# Patient Record
Sex: Female | Born: 1965 | Race: White | Hispanic: No | Marital: Married | Smoking: Never smoker
Health system: Southern US, Community
[De-identification: ages and names within clinical notes are randomized; demographics above are authoritative.]

## PROBLEM LIST (undated history)

## (undated) DIAGNOSIS — J45909 Unspecified asthma, uncomplicated: Secondary | ICD-10-CM

## (undated) DIAGNOSIS — G709 Myoneural disorder, unspecified: Secondary | ICD-10-CM

## (undated) DIAGNOSIS — Z9889 Other specified postprocedural states: Secondary | ICD-10-CM

## (undated) DIAGNOSIS — C439 Malignant melanoma of skin, unspecified: Secondary | ICD-10-CM

## (undated) DIAGNOSIS — Z8719 Personal history of other diseases of the digestive system: Secondary | ICD-10-CM

## (undated) DIAGNOSIS — G43909 Migraine, unspecified, not intractable, without status migrainosus: Secondary | ICD-10-CM

## (undated) DIAGNOSIS — F419 Anxiety disorder, unspecified: Secondary | ICD-10-CM

## (undated) DIAGNOSIS — R5383 Other fatigue: Secondary | ICD-10-CM

## (undated) DIAGNOSIS — H538 Other visual disturbances: Secondary | ICD-10-CM

## (undated) DIAGNOSIS — C4491 Basal cell carcinoma of skin, unspecified: Secondary | ICD-10-CM

## (undated) DIAGNOSIS — D649 Anemia, unspecified: Secondary | ICD-10-CM

## (undated) DIAGNOSIS — I10 Essential (primary) hypertension: Secondary | ICD-10-CM

## (undated) DIAGNOSIS — K469 Unspecified abdominal hernia without obstruction or gangrene: Secondary | ICD-10-CM

## (undated) DIAGNOSIS — M1990 Unspecified osteoarthritis, unspecified site: Secondary | ICD-10-CM

## (undated) DIAGNOSIS — R202 Paresthesia of skin: Secondary | ICD-10-CM

## (undated) DIAGNOSIS — M2550 Pain in unspecified joint: Secondary | ICD-10-CM

## (undated) DIAGNOSIS — R112 Nausea with vomiting, unspecified: Secondary | ICD-10-CM

## (undated) DIAGNOSIS — M5030 Other cervical disc degeneration, unspecified cervical region: Secondary | ICD-10-CM

## (undated) HISTORY — PX: TUBAL LIGATION: SHX77

## (undated) HISTORY — DX: Unspecified osteoarthritis, unspecified site: M19.90

## (undated) HISTORY — DX: Personal history of other diseases of the digestive system: Z87.19

## (undated) HISTORY — PX: MELANOMA EXCISION: SHX5266

## (undated) HISTORY — DX: Migraine, unspecified, not intractable, without status migrainosus: G43.909

## (undated) HISTORY — PX: HERNIA REPAIR: SHX51

## (undated) HISTORY — DX: Myoneural disorder, unspecified: G70.9

## (undated) HISTORY — PX: ABDOMINAL HYSTERECTOMY: SHX81

## (undated) HISTORY — PX: BREAST EXCISIONAL BIOPSY: SUR124

## (undated) HISTORY — DX: Essential (primary) hypertension: I10

## (undated) HISTORY — PX: OTHER SURGICAL HISTORY: SHX169

## (undated) HISTORY — PX: BREAST LUMPECTOMY: SHX2

## (undated) HISTORY — DX: Unspecified abdominal hernia without obstruction or gangrene: K46.9

## (undated) HISTORY — DX: Malignant melanoma of skin, unspecified: C43.9

## (undated) HISTORY — DX: Anemia, unspecified: D64.9

## (undated) HISTORY — PX: BREAST SURGERY: SHX581

## (undated) HISTORY — DX: Basal cell carcinoma of skin, unspecified: C44.91

## (undated) HISTORY — DX: Unspecified asthma, uncomplicated: J45.909

## (undated) HISTORY — PX: AUGMENTATION MAMMAPLASTY: SUR837

---

## 2000-06-21 HISTORY — PX: BREAST SURGERY: SHX581

## 2001-02-17 ENCOUNTER — Ambulatory Visit (HOSPITAL_COMMUNITY): Admission: RE | Admit: 2001-02-17 | Discharge: 2001-02-17 | Payer: Self-pay | Admitting: General Surgery

## 2006-06-21 HISTORY — PX: AUGMENTATION MAMMAPLASTY: SUR837

## 2009-08-05 ENCOUNTER — Encounter: Payer: Self-pay | Admitting: Cardiovascular Disease

## 2010-03-31 ENCOUNTER — Encounter: Admission: RE | Admit: 2010-03-31 | Discharge: 2010-03-31 | Payer: Self-pay | Admitting: Obstetrics & Gynecology

## 2012-07-17 ENCOUNTER — Ambulatory Visit (HOSPITAL_COMMUNITY)
Admission: RE | Admit: 2012-07-17 | Discharge: 2012-07-17 | Disposition: A | Discharge status: Home / Self Care | Payer: BC Managed Care – PPO | Source: Ambulatory Visit | Attending: Surgery | Admitting: Surgery

## 2012-07-17 ENCOUNTER — Other Ambulatory Visit (HOSPITAL_COMMUNITY): Payer: Self-pay | Admitting: *Deleted

## 2012-07-17 DIAGNOSIS — R52 Pain, unspecified: Secondary | ICD-10-CM

## 2012-07-17 DIAGNOSIS — R079 Chest pain, unspecified: Secondary | ICD-10-CM | POA: Insufficient documentation

## 2012-08-01 ENCOUNTER — Ambulatory Visit (INDEPENDENT_AMBULATORY_CARE_PROVIDER_SITE_OTHER): Payer: BC Managed Care – PPO | Admitting: Surgery

## 2012-08-01 ENCOUNTER — Encounter (INDEPENDENT_AMBULATORY_CARE_PROVIDER_SITE_OTHER): Payer: Self-pay | Admitting: Surgery

## 2012-08-01 VITALS — BP 112/64 | HR 74 | Temp 97.5°F | Resp 18 | Ht 63.0 in | Wt 122.6 lb

## 2012-08-01 DIAGNOSIS — N63 Unspecified lump in unspecified breast: Secondary | ICD-10-CM

## 2012-08-01 DIAGNOSIS — N6320 Unspecified lump in the left breast, unspecified quadrant: Secondary | ICD-10-CM | POA: Insufficient documentation

## 2012-08-01 NOTE — Patient Instructions (Signed)
We will schedule outpatient surgery to remove the small mass from your left breast.

## 2012-08-01 NOTE — Progress Notes (Signed)
Patient ID: Kayla Harris, female   DOB: 03/03/1966, 46 y.o.   MRN: [id number]  Chief Complaint  Patient presents with  . Breast Mass    Left    HPI Kayla Harris is a 46 y.o. female.  She was to have a mammogram followed by ultrasound. Solid mass was found at the 5 cm above the left nipple 12:00 position. She also had some adjacent cysts. She has implants. Recommendations for her followup core biopsy or excisional biopsy were made by the radiologist. She initially planned and it core biopsy but has now decided to have an excisional biopsy. She has a paternal aunt who died of breast cancer died at age 46. No history of ovarian cancer. She is quite concerned about this area because both her parents have had one type of cancer or another. She does not have any other significant symptoms. She had implants and "uplifts "about 6 years ago done at [place].  HPI  Past Medical History  Diagnosis Date  . Migraines   . Hernia   . Anemia   . Arthritis   . Asthma   . Melanoma     X3  . Hypertension   . Neuromuscular disorder     Past Surgical History  Procedure Laterality Date  . Tubal ligation      And reverse tubal ligation  . Hernia repair    . Breast surgery      "uplift"  . Tubal reversal    . Melanoma excision      X3    Family History  Problem Relation Age of Onset  . Colon cancer Mother   . Cancer Mother     Colon  . Colon cancer Father   . Cancer Father     Colon  . Cancer Paternal Aunt     Breast    Social History History  Substance Use Topics  . Smoking status: Never Smoker   . Smokeless tobacco: Never Used  . Alcohol Use: No    Allergies  Allergen Reactions  . Codeine Itching  . Imitrex (Sumatriptan)     Developed chest pain   . Penicillins     No current outpatient prescriptions on file.   No current facility-administered medications for this visit.    Review of Systems Review of Systems  Constitutional: Negative for fever, chills and  unexpected weight change.  HENT: Negative for hearing loss, congestion, sore throat, trouble swallowing and voice change.   Eyes: Negative for visual disturbance.  Respiratory: Negative for cough and wheezing.   Cardiovascular: Negative for chest pain, palpitations and leg swelling.  Gastrointestinal: Negative for nausea, vomiting, abdominal pain, diarrhea, constipation, blood in stool, abdominal distention and anal bleeding.  Genitourinary: Negative for hematuria, vaginal bleeding and difficulty urinating.  Musculoskeletal: Positive for arthralgias.  Skin: Negative for rash and wound.  Neurological: Negative for seizures, syncope and headaches.  Hematological: Negative for adenopathy. Does not bruise/bleed easily.  Psychiatric/Behavioral: Negative for confusion.    Blood pressure 112/64, pulse 74, temperature 97.5 F (36.4 C), temperature source Temporal, resp. rate 18, height 5' 3" (1.6 m), weight 122 lb 9.6 oz (55.611 kg), last menstrual period 07/05/2012.  Physical Exam Physical Exam  Vitals reviewed. Constitutional: She is oriented to person, place, and time. She appears well-developed and well-nourished. No distress.  HENT:  Head: Normocephalic and atraumatic.  Mouth/Throat: Oropharynx is clear and moist.  Eyes: Conjunctivae and EOM are normal. Pupils are equal, round, and reactive to light. No   scleral icterus.  Neck: Normal range of motion. Neck supple. No tracheal deviation present. No thyromegaly present.  Cardiovascular: Normal rate, regular rhythm, normal heart sounds and intact distal pulses.  Exam reveals no gallop and no friction rub.   No murmur heard. Pulmonary/Chest: Effort normal and breath sounds normal. No respiratory distress. She has no wheezes. She has no rales. Right breast exhibits no inverted nipple, no mass, no nipple discharge, no skin change and no tenderness. Left breast exhibits no inverted nipple, no mass, no nipple discharge, no skin change and no  tenderness. Breasts are symmetrical.  She is status post bilateral implant surgery.  Abdominal: Soft. Bowel sounds are normal. She exhibits no distension and no mass. There is no tenderness. There is no rebound and no guarding.  Musculoskeletal: Normal range of motion. She exhibits no edema and no tenderness.  Neurological: She is alert and oriented to person, place, and time.  Skin: Skin is warm and dry. No rash noted. She is not diaphoretic. No erythema.  Psychiatric: She has a normal mood and affect. Her behavior is normal. Judgment and thought content normal.    Data Reviewed I reviewed the mammogram and ultrasound reports done at the hospital in [place]. I used our office ultrasound but could not definitely identify the lesion in question  Assessment    Solid left breast mass     Plan  We have discussed again the alternatives and she wishes to do an excisional biopsy with wire loc.  I have discussed the indications for the lumpectomy and described the procedure. She understand that the chance of removal of the abnormal area is very good, but that occasionally we are unable to locate it and may have to do a second procedure. We also discussed the possibility of a second procedure to get additional tissue. Risks of surgery such as bleeding and infection have also been explained, as well as the implications of not doing the surgery. She understands and wishes to proceed.         Krisha Beegle J 08/01/2012, 3:38 PM    

## 2012-08-02 ENCOUNTER — Telehealth (INDEPENDENT_AMBULATORY_CARE_PROVIDER_SITE_OTHER): Payer: Self-pay | Admitting: General Surgery

## 2012-08-02 NOTE — Telephone Encounter (Signed)
Request for CD of films faxed to [facility].

## 2012-08-15 ENCOUNTER — Encounter (INDEPENDENT_AMBULATORY_CARE_PROVIDER_SITE_OTHER): Payer: Self-pay

## 2012-08-16 ENCOUNTER — Encounter (HOSPITAL_BASED_OUTPATIENT_CLINIC_OR_DEPARTMENT_OTHER): Payer: Self-pay | Admitting: *Deleted

## 2012-08-16 NOTE — Progress Notes (Signed)
No labs needed

## 2012-08-22 ENCOUNTER — Ambulatory Visit
Admission: RE | Admit: 2012-08-22 | Discharge: 2012-08-22 | Disposition: A | Discharge status: Home / Self Care | Payer: BC Managed Care – PPO | Source: Ambulatory Visit | Attending: Surgery | Admitting: Surgery

## 2012-08-22 ENCOUNTER — Encounter (HOSPITAL_BASED_OUTPATIENT_CLINIC_OR_DEPARTMENT_OTHER): Payer: Self-pay | Admitting: Certified Registered Nurse Anesthetist

## 2012-08-22 ENCOUNTER — Ambulatory Visit (HOSPITAL_BASED_OUTPATIENT_CLINIC_OR_DEPARTMENT_OTHER)
Admission: RE | Admit: 2012-08-22 | Discharge: 2012-08-22 | Disposition: A | Discharge status: Home / Self Care | Payer: BC Managed Care – PPO | Source: Ambulatory Visit | Attending: Surgery | Admitting: Surgery

## 2012-08-22 ENCOUNTER — Encounter (HOSPITAL_BASED_OUTPATIENT_CLINIC_OR_DEPARTMENT_OTHER)
Admission: RE | Disposition: A | Discharge status: Home / Self Care | Payer: Self-pay | Source: Ambulatory Visit | Attending: Surgery

## 2012-08-22 ENCOUNTER — Other Ambulatory Visit (INDEPENDENT_AMBULATORY_CARE_PROVIDER_SITE_OTHER): Payer: Self-pay | Admitting: Surgery

## 2012-08-22 ENCOUNTER — Ambulatory Visit (HOSPITAL_BASED_OUTPATIENT_CLINIC_OR_DEPARTMENT_OTHER): Payer: BC Managed Care – PPO | Admitting: Certified Registered Nurse Anesthetist

## 2012-08-22 DIAGNOSIS — N6320 Unspecified lump in the left breast, unspecified quadrant: Secondary | ICD-10-CM

## 2012-08-22 DIAGNOSIS — M129 Arthropathy, unspecified: Secondary | ICD-10-CM | POA: Insufficient documentation

## 2012-08-22 DIAGNOSIS — Z85820 Personal history of malignant melanoma of skin: Secondary | ICD-10-CM | POA: Insufficient documentation

## 2012-08-22 DIAGNOSIS — D249 Benign neoplasm of unspecified breast: Secondary | ICD-10-CM | POA: Insufficient documentation

## 2012-08-22 DIAGNOSIS — J45909 Unspecified asthma, uncomplicated: Secondary | ICD-10-CM | POA: Insufficient documentation

## 2012-08-22 DIAGNOSIS — I10 Essential (primary) hypertension: Secondary | ICD-10-CM | POA: Insufficient documentation

## 2012-08-22 HISTORY — PX: BREAST BIOPSY: SHX20

## 2012-08-22 HISTORY — DX: Other specified postprocedural states: Z98.890

## 2012-08-22 HISTORY — DX: Nausea with vomiting, unspecified: R11.2

## 2012-08-22 SURGERY — BREAST BIOPSY WITH NEEDLE LOCALIZATION
Anesthesia: General | Site: Breast | Laterality: Left | Wound class: Clean

## 2012-08-22 MED ORDER — OXYCODONE HCL 5 MG/5ML PO SOLN: 5.0000 mg | Freq: Once | ORAL | Status: AC | PRN

## 2012-08-22 MED ORDER — ONDANSETRON HCL 4 MG/2ML IJ SOLN
INTRAMUSCULAR | Status: DC | PRN
  Administered 2012-08-22: 4 mg via INTRAVENOUS

## 2012-08-22 MED ORDER — MIDAZOLAM HCL 2 MG/2ML IJ SOLN: 1.0000 mg | INTRAMUSCULAR | Status: DC | PRN

## 2012-08-22 MED ORDER — FENTANYL CITRATE 0.05 MG/ML IJ SOLN
INTRAMUSCULAR | Status: DC | PRN
  Administered 2012-08-22: 50 ug via INTRAVENOUS

## 2012-08-22 MED ORDER — CHLORHEXIDINE GLUCONATE 4 % EX LIQD: 1.0000 "application " | Freq: Once | CUTANEOUS | Status: DC

## 2012-08-22 MED ORDER — LIDOCAINE HCL (CARDIAC) 20 MG/ML IV SOLN
INTRAVENOUS | Status: DC | PRN
  Administered 2012-08-22: 60 mg via INTRAVENOUS

## 2012-08-22 MED ORDER — BUPIVACAINE HCL (PF) 0.25 % IJ SOLN
INTRAMUSCULAR | Status: DC | PRN
  Administered 2012-08-22: 20 mL

## 2012-08-22 MED ORDER — EPHEDRINE SULFATE 50 MG/ML IJ SOLN
INTRAMUSCULAR | Status: DC | PRN
  Administered 2012-08-22: 10 mg via INTRAVENOUS

## 2012-08-22 MED ORDER — FENTANYL CITRATE 0.05 MG/ML IJ SOLN: 50.0000 ug | INTRAMUSCULAR | Status: DC | PRN

## 2012-08-22 MED ORDER — PROPOFOL 10 MG/ML IV BOLUS
INTRAVENOUS | Status: DC | PRN
  Administered 2012-08-22: 200 mg via INTRAVENOUS

## 2012-08-22 MED ORDER — SCOPOLAMINE 1 MG/3DAYS TD PT72
1.0000 | MEDICATED_PATCH | TRANSDERMAL | Status: DC
  Administered 2012-08-22: 1.5 mg via TRANSDERMAL

## 2012-08-22 MED ORDER — HYDROMORPHONE HCL PF 1 MG/ML IJ SOLN
0.2500 mg | INTRAMUSCULAR | Status: DC | PRN
  Administered 2012-08-22: 0.5 mg via INTRAVENOUS
  Administered 2012-08-22: 0.25 mg via INTRAVENOUS

## 2012-08-22 MED ORDER — MIDAZOLAM HCL 5 MG/5ML IJ SOLN
INTRAMUSCULAR | Status: DC | PRN
  Administered 2012-08-22: 2 mg via INTRAVENOUS

## 2012-08-22 MED ORDER — ACETAMINOPHEN 10 MG/ML IV SOLN
1000.0000 mg | Freq: Once | INTRAVENOUS | Status: AC
  Administered 2012-08-22: 1000 mg via INTRAVENOUS

## 2012-08-22 MED ORDER — OXYCODONE HCL 5 MG PO TABS
5.0000 mg | ORAL_TABLET | Freq: Once | ORAL | Status: AC | PRN
  Administered 2012-08-22: 5 mg via ORAL

## 2012-08-22 MED ORDER — LACTATED RINGERS IV SOLN
INTRAVENOUS | Status: DC
  Administered 2012-08-22 (×2): via INTRAVENOUS

## 2012-08-22 MED ORDER — CIPROFLOXACIN IN D5W 400 MG/200ML IV SOLN
400.0000 mg | INTRAVENOUS | Status: AC
  Administered 2012-08-22: 400 mg via INTRAVENOUS

## 2012-08-22 MED ORDER — HYDROMORPHONE HCL 2 MG PO TABS: 2.0000 mg | ORAL_TABLET | ORAL | Status: DC | PRN

## 2012-08-22 MED ORDER — DEXAMETHASONE SODIUM PHOSPHATE 4 MG/ML IJ SOLN
INTRAMUSCULAR | Status: DC | PRN
  Administered 2012-08-22: 10 mg via INTRAVENOUS

## 2012-08-22 SURGICAL SUPPLY — 51 items
ADH SKN CLS APL DERMABOND .7 (GAUZE/BANDAGES/DRESSINGS) ×1
APPLICATOR COTTON TIP 6IN STRL (MISCELLANEOUS) IMPLANT
BINDER BREAST LRG (GAUZE/BANDAGES/DRESSINGS) IMPLANT
BINDER BREAST MEDIUM (GAUZE/BANDAGES/DRESSINGS) IMPLANT
BINDER BREAST XLRG (GAUZE/BANDAGES/DRESSINGS) IMPLANT
BINDER BREAST XXLRG (GAUZE/BANDAGES/DRESSINGS) IMPLANT
BLADE HEX COATED 2.75 (ELECTRODE) ×2 IMPLANT
BLADE SURG 15 STRL LF DISP TIS (BLADE) ×1 IMPLANT
BLADE SURG 15 STRL SS (BLADE) ×2
CANISTER SUCTION 1200CC (MISCELLANEOUS) ×1 IMPLANT
CHLORAPREP W/TINT 26ML (MISCELLANEOUS) ×2 IMPLANT
CLIP TI MEDIUM 6 (CLIP) IMPLANT
CLIP TI WIDE RED SMALL 6 (CLIP) IMPLANT
CLOTH BEACON ORANGE TIMEOUT ST (SAFETY) ×2 IMPLANT
COVER MAYO STAND STRL (DRAPES) ×2 IMPLANT
COVER TABLE BACK 60X90 (DRAPES) ×2 IMPLANT
DECANTER SPIKE VIAL GLASS SM (MISCELLANEOUS) IMPLANT
DERMABOND ADVANCED (GAUZE/BANDAGES/DRESSINGS) ×1
DERMABOND ADVANCED .7 DNX12 (GAUZE/BANDAGES/DRESSINGS) ×1 IMPLANT
DEVICE DUBIN W/COMP PLATE 8390 (MISCELLANEOUS) IMPLANT
DRAPE LAPAROTOMY TRNSV 102X78 (DRAPE) ×2 IMPLANT
DRAPE SURG 17X23 STRL (DRAPES) ×1 IMPLANT
DRAPE UTILITY XL STRL (DRAPES) ×2 IMPLANT
ELECT COATED BLADE 2.86 ST (ELECTRODE) ×1 IMPLANT
ELECT REM PT RETURN 9FT ADLT (ELECTROSURGICAL) ×2
ELECTRODE REM PT RTRN 9FT ADLT (ELECTROSURGICAL) ×1 IMPLANT
GLOVE BIO SURGEON STRL SZ 6.5 (GLOVE) ×2 IMPLANT
GLOVE EUDERMIC 7 POWDERFREE (GLOVE) ×2 IMPLANT
GOWN PREVENTION PLUS XLARGE (GOWN DISPOSABLE) ×5 IMPLANT
KIT MARKER MARGIN INK (KITS) IMPLANT
NDL HYPO 25X1 1.5 SAFETY (NEEDLE) ×1 IMPLANT
NEEDLE HYPO 22GX1.5 SAFETY (NEEDLE) ×1 IMPLANT
NEEDLE HYPO 25X1 1.5 SAFETY (NEEDLE) ×2 IMPLANT
NS IRRIG 1000ML POUR BTL (IV SOLUTION) IMPLANT
PACK BASIN DAY SURGERY FS (CUSTOM PROCEDURE TRAY) ×2 IMPLANT
PENCIL BUTTON HOLSTER BLD 10FT (ELECTRODE) ×2 IMPLANT
SHEET MEDIUM DRAPE 40X70 STRL (DRAPES) ×1 IMPLANT
SLEEVE SCD COMPRESS KNEE MED (MISCELLANEOUS) ×2 IMPLANT
SPONGE GAUZE 4X4 12PLY (GAUZE/BANDAGES/DRESSINGS) IMPLANT
SPONGE INTESTINAL PEANUT (DISPOSABLE) IMPLANT
SPONGE LAP 4X18 X RAY DECT (DISPOSABLE) ×2 IMPLANT
STAPLER VISISTAT 35W (STAPLE) IMPLANT
SUT MNCRL AB 4-0 PS2 18 (SUTURE) ×2 IMPLANT
SUT SILK 0 TIES 10X30 (SUTURE) IMPLANT
SUT SILK 2 0 FS (SUTURE) IMPLANT
SUT VICRYL 3-0 CR8 SH (SUTURE) ×2 IMPLANT
SYR CONTROL 10ML LL (SYRINGE) ×2 IMPLANT
TOWEL OR NON WOVEN STRL DISP B (DISPOSABLE) ×1 IMPLANT
TUBE CONNECTING 20X1/4 (TUBING) ×2 IMPLANT
WATER STERILE IRR 1000ML POUR (IV SOLUTION) ×1 IMPLANT
YANKAUER SUCT BULB TIP NO VENT (SUCTIONS) ×2 IMPLANT

## 2012-08-22 NOTE — Op Note (Signed)
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  Description of procedure: The patient was seen in the holding area and the plans for the procedure reviewed. The left breast was marked as the operative side. The wire localizing films were reviewed.  The patient was taken to the operating room and after satisfactory general anesthesia had been obtained the left breast was prepped and draped and the timeout was performed.  The incision was made over the presumed area of the mass. The guidewire entered laterally and tracked medially. The tract of the wire was grasped with an Allis clamp and the area excised trying to stay posteror to the wire as it appeared to be at the anterior edge of the lesion; As I got towards the end of the tract I saw a well circumcribed nodule consistent with a fibroadenoma and thought this was the abnormality to be removed. I didn't take more posterior tissue as I was concerned about getting too close to her implant.The specimen mammogram was reviewed with the radiologist  . Bleeders were controlled with either cautery or sutures as needed.  After achieving hemostasis, the incision was closed with 3-0 Vicryl, 4-0 Monocryl subcuticular, and Dermabond.  The patient tolerated the procedure well. There were no operative complications. All counts were correct.   EBL: Minimal  Currie Paris, MD, FACS 08/22/2012 11:05 AM

## 2012-08-22 NOTE — Anesthesia Preprocedure Evaluation (Signed)
Anesthesia Evaluation  Patient identified by MRN, date of birth, ID band Patient awake    Reviewed: Allergy & Precautions, H&P , NPO status , Patient's Chart, lab work & pertinent test results  History of Anesthesia Complications (+) PONV  Airway Mallampati: II TM Distance: >3 FB Neck ROM: Full    Dental no notable dental hx. (+) Teeth Intact and Dental Advisory Given   Pulmonary neg pulmonary ROS,  breath sounds clear to auscultation  Pulmonary exam normal       Cardiovascular negative cardio ROS  Rhythm:Regular Rate:Normal     Neuro/Psych negative neurological ROS  negative psych ROS   GI/Hepatic negative GI ROS, Neg liver ROS,   Endo/Other  negative endocrine ROS  Renal/GU negative Renal ROS  negative genitourinary   Musculoskeletal   Abdominal   Peds  Hematology negative hematology ROS (+)   Anesthesia Other Findings   Reproductive/Obstetrics negative OB ROS                           Anesthesia Physical Anesthesia Plan  ASA: I  Anesthesia Plan: General   Post-op Pain Management:    Induction: Intravenous  Airway Management Planned: LMA  Additional Equipment:   Intra-op Plan:   Post-operative Plan: Extubation in OR  Informed Consent: I have reviewed the patients History and Physical, chart, labs and discussed the procedure including the risks, benefits and alternatives for the proposed anesthesia with the patient or authorized representative who has indicated his/her understanding and acceptance.   Dental advisory given  Plan Discussed with: CRNA  Anesthesia Plan Comments:         Anesthesia Quick Evaluation

## 2012-08-22 NOTE — Anesthesia Procedure Notes (Signed)
Procedure Name: LMA Insertion Date/Time: 08/22/2012 10:19 AM Performed by: BLOCKER, TIMOTHY D Pre-anesthesia Checklist: Patient identified, Emergency Drugs available, Suction available and Patient being monitored Patient Re-evaluated:Patient Re-evaluated prior to inductionOxygen Delivery Method: Circle System Utilized Preoxygenation: Pre-oxygenation with 100% oxygen Intubation Type: IV induction Ventilation: Mask ventilation without difficulty LMA: LMA with gastric port inserted LMA Size: 4.0 Number of attempts: 1 Placement Confirmation: positive ETCO2 Tube secured with: Tape Dental Injury: Teeth and Oropharynx as per pre-operative assessment

## 2012-08-22 NOTE — Interval H&P Note (Signed)
History and Physical Interval Note:  08/22/2012 10:02 AM  Kayla Harris  has presented today for surgery, with the diagnosis of left breast mass  The various methods of treatment have been discussed with the patient and family. After consideration of risks, benefits and other options for treatment, the patient has consented to  Procedure(s) with comments: Needle localization removal left breast mass (Left) - Needle localization removal left breast mass as a surgical intervention .  The patient's history has been reviewed, patient examined, no change in status, stable for surgery.  I have reviewed the patient's chart and labs.  Questions were answered to the patient's satisfaction.     STRECK,CHRISTIAN J

## 2012-08-22 NOTE — Anesthesia Postprocedure Evaluation (Signed)
  Anesthesia Post-op Note  Patient: Kayla Harris  Procedure(s) Performed: Procedure(s) with comments: Needle localization removal left breast mass (Left) - Needle localization removal left breast mass  Patient Location: PACU  Anesthesia Type:General  Level of Consciousness: awake and alert   Airway and Oxygen Therapy: Patient Spontanous Breathing  Post-op Pain: mild  Post-op Assessment: Post-op Vital signs reviewed, Patient's Cardiovascular Status Stable, Respiratory Function Stable, Patent Airway and No signs of Nausea or vomiting  Post-op Vital Signs: Reviewed and stable  Complications: No apparent anesthesia complications

## 2012-08-22 NOTE — Transfer of Care (Signed)
Immediate Anesthesia Transfer of Care Note  Patient: Kayla Harris  Procedure(s) Performed: Procedure(s) with comments: Needle localization removal left breast mass (Left) - Needle localization removal left breast mass  Patient Location: PACU  Anesthesia Type:General  Level of Consciousness: awake, alert , oriented and patient cooperative  Airway & Oxygen Therapy: Patient Spontanous Breathing and Patient connected to face mask oxygen  Post-op Assessment: Report given to PACU RN and Post -op Vital signs reviewed and stable  Post vital signs: Reviewed and stable  Complications: No apparent anesthesia complications

## 2012-08-22 NOTE — H&P (View-Only) (Signed)
Patient ID: Kayla Harris, female   DOB: December 06, 1965, 47 y.o.   MRN: [id number]  Chief Complaint  Patient presents with  . Breast Mass    Left    HPI Kayla Harris is a 47 y.o. female.  She was to have a mammogram followed by ultrasound. Solid mass was found at the 5 cm above the left nipple 12:00 position. She also had some adjacent cysts. She has implants. Recommendations for her followup core biopsy or excisional biopsy were made by the radiologist. She initially planned and it core biopsy but has now decided to have an excisional biopsy. She has a paternal aunt who died of breast cancer died at age 32. No history of ovarian cancer. She is quite concerned about this area because both her parents have had one type of cancer or another. She does not have any other significant symptoms. She had implants and "uplifts "about 6 years ago done at [facility]  HPI  Past Medical History  Diagnosis Date  . Migraines   . Hernia   . Anemia   . Arthritis   . Asthma   . Melanoma     X3  . Hypertension   . Neuromuscular disorder     Past Surgical History  Procedure Laterality Date  . Tubal ligation      And reverse tubal ligation  . Hernia repair    . Breast surgery      "uplift"  . Tubal reversal    . Melanoma excision      X3    Family History  Problem Relation Age of Onset  . Colon cancer Mother   . Cancer Mother     Colon  . Colon cancer Father   . Cancer Father     Colon  . Cancer Paternal Aunt     Breast    Social History History  Substance Use Topics  . Smoking status: Never Smoker   . Smokeless tobacco: Never Used  . Alcohol Use: No    Allergies  Allergen Reactions  . Codeine Itching  . Imitrex (Sumatriptan)     Developed chest pain   . Penicillins     No current outpatient prescriptions on file.   No current facility-administered medications for this visit.    Review of Systems Review of Systems  Constitutional: Negative for fever, chills and  unexpected weight change.  HENT: Negative for hearing loss, congestion, sore throat, trouble swallowing and voice change.   Eyes: Negative for visual disturbance.  Respiratory: Negative for cough and wheezing.   Cardiovascular: Negative for chest pain, palpitations and leg swelling.  Gastrointestinal: Negative for nausea, vomiting, abdominal pain, diarrhea, constipation, blood in stool, abdominal distention and anal bleeding.  Genitourinary: Negative for hematuria, vaginal bleeding and difficulty urinating.  Musculoskeletal: Positive for arthralgias.  Skin: Negative for rash and wound.  Neurological: Negative for seizures, syncope and headaches.  Hematological: Negative for adenopathy. Does not bruise/bleed easily.  Psychiatric/Behavioral: Negative for confusion.    Blood pressure 112/64, pulse 74, temperature 97.5 F (36.4 C), temperature source Temporal, resp. rate 18, height 5\' 3"  (1.6 m), weight 122 lb 9.6 oz (55.611 kg), last menstrual period 07/05/2012.  Physical Exam Physical Exam  Vitals reviewed. Constitutional: She is oriented to person, place, and time. She appears well-developed and well-nourished. No distress.  HENT:  Head: Normocephalic and atraumatic.  Mouth/Throat: Oropharynx is clear and moist.  Eyes: Conjunctivae and EOM are normal. Pupils are equal, round, and reactive to light. No  scleral icterus.  Neck: Normal range of motion. Neck supple. No tracheal deviation present. No thyromegaly present.  Cardiovascular: Normal rate, regular rhythm, normal heart sounds and intact distal pulses.  Exam reveals no gallop and no friction rub.   No murmur heard. Pulmonary/Chest: Effort normal and breath sounds normal. No respiratory distress. She has no wheezes. She has no rales. Right breast exhibits no inverted nipple, no mass, no nipple discharge, no skin change and no tenderness. Left breast exhibits no inverted nipple, no mass, no nipple discharge, no skin change and no  tenderness. Breasts are symmetrical.  She is status post bilateral implant surgery.  Abdominal: Soft. Bowel sounds are normal. She exhibits no distension and no mass. There is no tenderness. There is no rebound and no guarding.  Musculoskeletal: Normal range of motion. She exhibits no edema and no tenderness.  Neurological: She is alert and oriented to person, place, and time.  Skin: Skin is warm and dry. No rash noted. She is not diaphoretic. No erythema.  Psychiatric: She has a normal mood and affect. Her behavior is normal. Judgment and thought content normal.    Data Reviewed I reviewed the mammogram and ultrasound reports done at the hospital in [place]. I used our office ultrasound but could not definitely identify the lesion in question  Assessment    Solid left breast mass     Plan  We have discussed again the alternatives and she wishes to do an excisional biopsy with wire loc.  I have discussed the indications for the lumpectomy and described the procedure. She understand that the chance of removal of the abnormal area is very good, but that occasionally we are unable to locate it and may have to do a second procedure. We also discussed the possibility of a second procedure to get additional tissue. Risks of surgery such as bleeding and infection have also been explained, as well as the implications of not doing the surgery. She understands and wishes to proceed.         Kayla Harris J 08/01/2012, 3:38 PM

## 2012-08-23 ENCOUNTER — Encounter (HOSPITAL_BASED_OUTPATIENT_CLINIC_OR_DEPARTMENT_OTHER): Payer: Self-pay | Admitting: Surgery

## 2012-08-23 ENCOUNTER — Telehealth (INDEPENDENT_AMBULATORY_CARE_PROVIDER_SITE_OTHER): Payer: Self-pay | Admitting: General Surgery

## 2012-08-23 NOTE — Telephone Encounter (Signed)
Message copied by Liliana Cline on Wed Aug 23, 2012  1:46 PM ------      Message from: Currie Paris      Created: Wed Aug 23, 2012  1:26 PM       Tell her path is benign and as expected ------

## 2012-08-23 NOTE — Telephone Encounter (Signed)
Patient aware path results are good. She will follow up in the office at her scheduled appt and call with any questions prior. She does complain of itching with pain medication. She states benadryl not helping. She is going to stop pain medicine and switch to ibuprofen. I advised if this does not cover her pain to let us know. She will call back if needed.

## 2012-08-23 NOTE — Addendum Note (Signed)
Addendum created 08/23/12 1610 by Jewel Baize Lillyan Hitson, CRNA   Modules edited: Anesthesia Responsible Staff

## 2012-09-05 ENCOUNTER — Encounter (INDEPENDENT_AMBULATORY_CARE_PROVIDER_SITE_OTHER): Payer: Self-pay | Admitting: Surgery

## 2012-09-05 ENCOUNTER — Ambulatory Visit (INDEPENDENT_AMBULATORY_CARE_PROVIDER_SITE_OTHER): Payer: BC Managed Care – PPO | Admitting: Surgery

## 2012-09-05 VITALS — BP 100/62 | HR 60 | Temp 98.6°F | Resp 18 | Ht 63.0 in | Wt 124.0 lb

## 2012-09-05 DIAGNOSIS — N6320 Unspecified lump in the left breast, unspecified quadrant: Secondary | ICD-10-CM

## 2012-09-05 DIAGNOSIS — N63 Unspecified lump in unspecified breast: Secondary | ICD-10-CM

## 2012-09-05 NOTE — Progress Notes (Signed)
NAME: Kayla Harris                                            DOB: 1965-07-25 DATE: 09/05/2012                                                  MRN: [id number]  CC:  Chief Complaint  Patient presents with  . Routine Post Op    1st po be mass    HPI: This patient comes in for post op follow-up .Sheunderwent removal of a left breast fibroadenoma on 08/22/12. She feels that she is doing well.  PE:  VITAL SIGNS: BP 100/62  Pulse 60  Temp(Src) 98.6 F (37 C) (Temporal)  Resp 18  Ht 5\' 3"  (1.6 m)  Wt 124 lb (56.246 kg)  BMI 21.97 kg/m2  General: The patient appears to be healthy, NAD Incision:Healing nicely, no infection or problem  DATA REVIEWED: Path: Diagnosis Breast, lumpectomy, Left - FIBROADENOMA, 1.7 CM. - THERE IS NO EVIDENCE OF MALIGNANCY. - SEE COMMENT.  IMPRESSION: The patient is doing well S/P excision of left breast fibroadenoma.    PLAN: RTC PRN

## 2012-09-05 NOTE — Patient Instructions (Signed)
We will see you again on an as needed basis. Please call the office at [phone] if you have any questions or concerns. Thank you for allowing us to take care of you.  

## 2013-01-02 ENCOUNTER — Encounter (HOSPITAL_COMMUNITY): Payer: Self-pay

## 2013-01-02 ENCOUNTER — Emergency Department (HOSPITAL_COMMUNITY)
Admission: EM | Admit: 2013-01-02 | Discharge: 2013-01-02 | Disposition: A | Discharge status: Home / Self Care | Payer: BC Managed Care – PPO | Attending: Emergency Medicine | Admitting: Emergency Medicine

## 2013-01-02 ENCOUNTER — Emergency Department (HOSPITAL_COMMUNITY): Payer: BC Managed Care – PPO

## 2013-01-02 ENCOUNTER — Other Ambulatory Visit: Payer: Self-pay

## 2013-01-02 DIAGNOSIS — Z8719 Personal history of other diseases of the digestive system: Secondary | ICD-10-CM | POA: Insufficient documentation

## 2013-01-02 DIAGNOSIS — R05 Cough: Secondary | ICD-10-CM | POA: Insufficient documentation

## 2013-01-02 DIAGNOSIS — R109 Unspecified abdominal pain: Secondary | ICD-10-CM | POA: Insufficient documentation

## 2013-01-02 DIAGNOSIS — Z880 Allergy status to penicillin: Secondary | ICD-10-CM | POA: Insufficient documentation

## 2013-01-02 DIAGNOSIS — I10 Essential (primary) hypertension: Secondary | ICD-10-CM | POA: Insufficient documentation

## 2013-01-02 DIAGNOSIS — M549 Dorsalgia, unspecified: Secondary | ICD-10-CM | POA: Insufficient documentation

## 2013-01-02 DIAGNOSIS — R0789 Other chest pain: Secondary | ICD-10-CM | POA: Insufficient documentation

## 2013-01-02 DIAGNOSIS — Z85820 Personal history of malignant melanoma of skin: Secondary | ICD-10-CM | POA: Insufficient documentation

## 2013-01-02 DIAGNOSIS — M542 Cervicalgia: Secondary | ICD-10-CM | POA: Insufficient documentation

## 2013-01-02 DIAGNOSIS — Z8679 Personal history of other diseases of the circulatory system: Secondary | ICD-10-CM | POA: Insufficient documentation

## 2013-01-02 DIAGNOSIS — Z862 Personal history of diseases of the blood and blood-forming organs and certain disorders involving the immune mechanism: Secondary | ICD-10-CM | POA: Insufficient documentation

## 2013-01-02 DIAGNOSIS — Z8739 Personal history of other diseases of the musculoskeletal system and connective tissue: Secondary | ICD-10-CM | POA: Insufficient documentation

## 2013-01-02 DIAGNOSIS — R059 Cough, unspecified: Secondary | ICD-10-CM | POA: Insufficient documentation

## 2013-01-02 DIAGNOSIS — R0602 Shortness of breath: Secondary | ICD-10-CM | POA: Insufficient documentation

## 2013-01-02 DIAGNOSIS — J45909 Unspecified asthma, uncomplicated: Secondary | ICD-10-CM | POA: Insufficient documentation

## 2013-01-02 DIAGNOSIS — Z8669 Personal history of other diseases of the nervous system and sense organs: Secondary | ICD-10-CM | POA: Insufficient documentation

## 2013-01-02 DIAGNOSIS — R079 Chest pain, unspecified: Secondary | ICD-10-CM

## 2013-01-02 DIAGNOSIS — R5381 Other malaise: Secondary | ICD-10-CM | POA: Insufficient documentation

## 2013-01-02 LAB — TROPONIN I: Troponin I: <0.3 ng/mL (ref <0.30)

## 2013-01-02 LAB — HEPATIC FUNCTION PANEL
ALT: 41 U/L — ABNORMAL HIGH (ref 0–35)
AST: 33 U/L (ref 0–37)
Albumin: 4.2 g/dL (ref 3.5–5.2)
Alkaline Phosphatase: 89 U/L (ref 39–117)
Total Protein: 7.5 g/dL (ref 6.0–8.3)

## 2013-01-02 LAB — URINE MICROSCOPIC-ADD ON

## 2013-01-02 LAB — URINALYSIS, ROUTINE W REFLEX MICROSCOPIC
Bilirubin Urine: NEGATIVE
Glucose, UA: NEGATIVE mg/dL
Specific Gravity, Urine: <1.005 — ABNORMAL LOW (ref 1.005–1.030)
Urobilinogen, UA: 0.2 mg/dL (ref 0.0–1.0)
pH: 5.5 (ref 5.0–8.0)

## 2013-01-02 LAB — BASIC METABOLIC PANEL
CO2: 27 mEq/L (ref 19–32)
Calcium: 9.8 mg/dL (ref 8.4–10.5)
Chloride: 102 mEq/L (ref 96–112)
Glucose, Bld: 96 mg/dL (ref 70–99)
Sodium: 139 mEq/L (ref 135–145)

## 2013-01-02 LAB — CBC WITH DIFFERENTIAL/PLATELET
Basophils Absolute: 0 10*3/uL (ref 0.0–0.1)
Eosinophils Relative: 1 % (ref 0–5)
HCT: 34 % — ABNORMAL LOW (ref 36.0–46.0)
Lymphocytes Relative: 25 % (ref 12–46)
Lymphs Abs: 2 10*3/uL (ref 0.7–4.0)
MCV: 84.8 fL (ref 78.0–100.0)
Neutro Abs: 5.2 10*3/uL (ref 1.7–7.7)
Platelets: 254 10*3/uL (ref 150–400)
RBC: 4.01 MIL/uL (ref 3.87–5.11)
RDW: 12 % (ref 11.5–15.5)
WBC: 8 10*3/uL (ref 4.0–10.5)

## 2013-01-02 MED ORDER — IOHEXOL 350 MG/ML SOLN
100.0000 mL | Freq: Once | INTRAVENOUS | Status: AC | PRN
  Administered 2013-01-02: 100 mL via INTRAVENOUS

## 2013-01-02 MED ORDER — LORAZEPAM 2 MG/ML IJ SOLN
0.5000 mg | Freq: Once | INTRAMUSCULAR | Status: AC
  Administered 2013-01-02: 0.5 mg via INTRAVENOUS
  Filled 2013-01-02: qty 1

## 2013-01-02 MED ORDER — OXYCODONE-ACETAMINOPHEN 5-325 MG PO TABS
1.0000 | ORAL_TABLET | Freq: Once | ORAL | Status: AC
  Administered 2013-01-02: 1 via ORAL
  Filled 2013-01-02: qty 1

## 2013-01-02 MED ORDER — ALBUTEROL SULFATE (5 MG/ML) 0.5% IN NEBU
5.0000 mg | INHALATION_SOLUTION | Freq: Once | RESPIRATORY_TRACT | Status: AC
  Administered 2013-01-02: 5 mg via RESPIRATORY_TRACT
  Filled 2013-01-02: qty 1

## 2013-01-02 MED ORDER — HYDROCODONE-ACETAMINOPHEN 5-325 MG PO TABS: 1.0000 | ORAL_TABLET | Freq: Four times a day (QID) | ORAL | Status: DC | PRN

## 2013-01-02 MED ORDER — LORAZEPAM 1 MG PO TABS: ORAL_TABLET | ORAL | Status: DC

## 2013-01-02 NOTE — ED Provider Notes (Signed)
History  This chart was scribed for Kayla Lennert, MD, by Kayla Harris, ED Scribe. This patient was seen in room APA02/APA02 and the patient's care was started at 5:05 PM  CSN: [id number] Arrival date & time 01/02/13  1644  First MD Initiated Contact with Patient 01/02/13 1652     Chief Complaint  Patient presents with  . Chest Pain  . Shortness of Breath    Patient is a 47 y.o. female presenting with chest pain. The history is provided by the patient. No language interpreter was used.  Chest Pain Pain location:  L chest Pain quality: aching   Pain radiates to:  Does not radiate Pain radiates to the back: no   Pain severity:  Moderate Associated symptoms: back pain (left upper back pain), fatigue and shortness of breath    HPI Comments: Kayla Harris is a 47 y.o. female who presents to the Emergency Department complaining of SOB and chest pressure that started three days ago.  She is also experiencing cough, left sided neck pain, pain to left mid back, upper abdominal pain, and fatigue.  She denies nausea.  Pt reports she has also noticed dark spots all over her body over the last few months.  She has h/o melanomas.  Pt has h/o partial mastectomy four months ago.  Pt reports family h/o colon cancer and reports her last colonoscopy was earlier this year.   Kayla Harris PCP    Past Medical History  Diagnosis Date  . Migraines   . Hernia   . Anemia   . Arthritis   . Hypertension   . Neuromuscular disorder   . Asthma     hx  . Melanoma     X3  . PONV (postoperative nausea and vomiting)    Past Surgical History  Procedure Laterality Date  . Tubal ligation      And reverse tubal ligation  . Hernia repair    . Tubal reversal    . Melanoma excision      X3  . Breast surgery      "uplift"  . Breast surgery  2002    lt lump-negative  . Breast biopsy Left 08/22/2012    Procedure: Needle localization removal left breast mass;  Surgeon: Currie Paris, MD;  Location:  [facility];  Service: General;  Laterality: Left;  Needle localization removal left breast mass   Family History  Problem Relation Age of Onset  . Colon cancer Mother   . Cancer Mother     Colon  . Colon cancer Father   . Cancer Father     Colon  . Cancer Paternal Aunt     Breast   History  Substance Use Topics  . Smoking status: Never Smoker   . Smokeless tobacco: Never Used  . Alcohol Use: No   OB History   Grav Para Term Preterm Abortions TAB SAB Ect Mult Living                 Review of Systems  Constitutional: Positive for fatigue.  HENT: Positive for neck pain (left sided).   Respiratory: Positive for chest tightness and shortness of breath.   Cardiovascular: Positive for chest pain.  Musculoskeletal: Positive for back pain (left upper back pain).  All other systems reviewed and are negative.    Allergies  Dilaudid; Codeine; Imitrex; and Penicillins  Home Medications   Current Outpatient Rx  Name  Route  Sig  Dispense  Refill  .  diphenhydrAMINE (BENADRYL) 25 MG tablet   Oral   Take 25 mg by mouth every 6 (six) hours as needed for itching.          BP 147/83  Pulse 80  Temp(Src) 98.2 F (36.8 C) (Oral)  Resp 20  Ht 5\' 3"  (1.6 m)  Wt 120 lb (54.432 kg)  BMI 21.26 kg/m2  SpO2 100%  LMP 12/31/2012 Physical Exam  Nursing note and vitals reviewed. Constitutional: She is oriented to person, place, and time. She appears well-developed and well-nourished. No distress.  HENT:  Head: Normocephalic and atraumatic.  Eyes: EOM are normal.  Neck: Neck supple. No tracheal deviation present.  Cardiovascular: Normal rate.   Pulmonary/Chest: Effort normal. No respiratory distress.  Abdominal: There is tenderness (mild diffuse tenderness).  Musculoskeletal: Normal range of motion.  Neurological: She is alert and oriented to person, place, and time.  Skin: Skin is warm and dry.  Psychiatric: She has a normal mood and affect. Her behavior is  normal.    ED Course  Procedures   DIAGNOSTIC STUDIES Oxygen Saturation is 100% on room air, normal by my interpretation.    COORDINATION OF CARE:  5:13 PM Discussed course of care with pt which includes basic blood work.  Pt understands and agrees.   7:50 PM Discussed results with pt and need for chest xray.  Pt understands and agrees.    8:46 PM Discussed results with pt.  Advised pt to follow up with PCP next week.  Pt request breathing treatment stating that she feels like it is hard to breath.   9:43 PM Breathing is unchanged after breathing treatment   Labs Reviewed  CBC WITH DIFFERENTIAL - Abnormal; Notable for the following:    HCT 34.0 (*)    All other components within normal limits  BASIC METABOLIC PANEL - Abnormal; Notable for the following:    Potassium 3.3 (*)    All other components within normal limits  HEPATIC FUNCTION PANEL - Abnormal; Notable for the following:    ALT 41 (*)    All other components within normal limits  D-DIMER, QUANTITATIVE - Abnormal; Notable for the following:    D-Dimer, Quant 0.53 (*)    All other components within normal limits  URINALYSIS, ROUTINE W REFLEX MICROSCOPIC - Abnormal; Notable for the following:    Specific Gravity, Urine <1.005 (*)    Hgb urine dipstick TRACE (*)    All other components within normal limits  TROPONIN I  URINE MICROSCOPIC-ADD ON   Ct Angio Chest Pe W/cm &/or Wo Cm  01/02/2013   *RADIOLOGY REPORT*  Clinical Data: Chest pressure, elevated D-dimer  CT ANGIOGRAPHY CHEST  Technique:  Multidetector CT imaging of the chest using the standard protocol during bolus administration of intravenous contrast. Multiplanar reconstructed images including MIPs were obtained and reviewed to evaluate the vascular anatomy.  Contrast: OMNIPAQUE IOHEXOL 350 MG/ML SOLN  Comparison: None.  Findings: No filling defects in the pulmonary arteries to suggest acute pulmonary embolism.  No acute findings of the aorta or great  vessels.  No pericardial fluid.  Review of the lung parenchyma demonstrates no pulmonary edema or pleural fluid.  There is no axillary or supraclavicular lymphadenopathy.  Bilateral subglandular breast implants are noted.  Limited view of the upper abdomen is unremarkable.  Limited view the skeleton is unremarkable.  IMPRESSION: No evidence of pulmonary embolism.   Original Report Authenticated By: Genevive Bi, M.D.   Dg Abd Acute W/chest  01/02/2013   *RADIOLOGY  REPORT*  Clinical Data: Chest pressures since Sunday.  Cough.  Abdominal swelling.  ACUTE ABDOMEN SERIES (ABDOMEN 2 VIEW & CHEST 1 VIEW)  Comparison: None.  Findings: The lungs are clear and well aerated.  No effusion or pneumothorax.  Normal heart size and mediastinal contours. There is a 1 cm, dense for size nodule in the left apex, with well-defined margins.  No abnormal intra-abdominal mass effect or calcification.  No pneumoperitoneum or evidence of pneumatosis.  There are gas filled loops of small bowel, more noticeable in the right abdomen, none dilated however.  Relative paucity of colonic gas.  No significant osseous abnormality.  IMPRESSION: 1.  Negative for acute cardiopulmonary disease. 2.  Nonspecific bowel gas pattern.  No high-grade bowel obstruction suspected.  No pneumoperitoneum. 3.  1 cm left upper lung nodule, most likely pleural parenchymal scarring.  Recommend CT confirmation or radiographic follow up on an outpatient basis.   Original Report Authenticated By: Tiburcio Pea   No diagnosis found.  Date: 01/02/2013  Rate: 70  Rhythm: normal sinus rhythm  QRS Axis: normal  Intervals: normal  ST/T Wave abnormalities: normal  Conduction Disutrbances:none  Narrative Interpretation:   Old EKG Reviewed: none available   MDM   ,j ,The chart was scribed for me under my direct supervision.  I personally performed the history, physical, and medical decision making and all procedures in the evaluation of this  patient.Kayla Lennert, MD 01/02/13 2151

## 2013-01-02 NOTE — ED Notes (Signed)
Pt c/o SOB and chest pressure Sunday night.  Reports has pain under left arm and into left side of neck for several months.    Pt tearful.  Says feels like she can't get enough air in.  Reports has zero energy for the past year.   Pt also c/o swelling in abd and brown spots "all over me."

## 2013-01-02 NOTE — ED Notes (Signed)
MD at bedside. 

## 2013-01-04 ENCOUNTER — Telehealth (HOSPITAL_COMMUNITY): Payer: Self-pay | Admitting: *Deleted

## 2013-01-15 ENCOUNTER — Telehealth: Payer: Self-pay | Admitting: Obstetrics & Gynecology

## 2013-01-15 ENCOUNTER — Encounter: Payer: Self-pay | Admitting: Gynecology

## 2013-01-15 ENCOUNTER — Ambulatory Visit (INDEPENDENT_AMBULATORY_CARE_PROVIDER_SITE_OTHER): Payer: BC Managed Care – PPO | Admitting: Gynecology

## 2013-01-15 VITALS — BP 118/74 | Temp 97.9°F | Resp 14 | Ht 61.25 in | Wt 130.0 lb

## 2013-01-15 DIAGNOSIS — D259 Leiomyoma of uterus, unspecified: Secondary | ICD-10-CM

## 2013-01-15 DIAGNOSIS — R140 Abdominal distension (gaseous): Secondary | ICD-10-CM

## 2013-01-15 DIAGNOSIS — R102 Pelvic and perineal pain: Secondary | ICD-10-CM

## 2013-01-15 DIAGNOSIS — R141 Gas pain: Secondary | ICD-10-CM

## 2013-01-15 DIAGNOSIS — R143 Flatulence: Secondary | ICD-10-CM

## 2013-01-15 DIAGNOSIS — N949 Unspecified condition associated with female genital organs and menstrual cycle: Secondary | ICD-10-CM

## 2013-01-15 NOTE — Telephone Encounter (Signed)
Patient states went to E.R. Couple weeks ago. 01/02/2013. Was told then needed to follow up with her provider. States all test done in E.R. Were normal/ negative. Patient calling today with abdomen bloated, a lot of vaginal pressure . Burning back pain. Patient given appt. Today with Dr. Farrel Gobble @ 3;00pm.

## 2013-01-15 NOTE — Progress Notes (Signed)
Subjective:     Patient ID: Kayla Harris, female   DOB: 02/04/1966, 47 y.o.   MRN: [id number]  HPI Comments: Pt here for complaints of abdominal bloating and sensation of vaginal pressure.  Pt has looked and sees nothing.  Pt was seen in Er recently with complaints of shortness of breath.  Pt had a negative evaluation including normal treponin, EKG, and ruled out PE.  CT scan normal except showed a 1cm lupper lobe nodule needing floow up.  Pt still complains of shortness of breath, fatigue and abdominal bloating. Pt reports menses are normal, monthly with left lower quadrant pain everytime.  Pt denies this pain with sex but notes it during ovulation as well.  Pt reports using fleet suppositories twice a day for years for bowel movements    Review of Systems  Constitutional: Positive for chills, activity change (less) and fatigue. Negative for fever.  HENT: Negative.        Pt reports tingling over lip  Eyes: Negative.   Respiratory: Positive for chest tightness and shortness of breath. Negative for cough, choking and wheezing.   Cardiovascular: Negative for chest pain, palpitations and leg swelling.  Gastrointestinal: Positive for abdominal pain (right upper quadrant) and abdominal distention. Negative for nausea, vomiting, diarrhea, constipation, blood in stool and anal bleeding.  Endocrine: Negative for cold intolerance and heat intolerance.  Genitourinary: Positive for pelvic pain (LLQ with menses). Negative for dysuria, frequency, hematuria, flank pain, enuresis, difficulty urinating, genital sores and dyspareunia.  Musculoskeletal: Positive for myalgias and back pain. Arthralgias: nonspecific.  Skin: Negative for color change, pallor and rash.  Neurological: Positive for numbness (upper lip). Negative for tremors and weakness.  Hematological: Negative for adenopathy. Does not bruise/bleed easily.       Objective:   Physical Exam  Constitutional: She is oriented to person, place, and  time. She appears well-developed and well-nourished.  Cardiovascular: Normal rate and regular rhythm.   Pulmonary/Chest: Effort normal and breath sounds normal. No respiratory distress. She has no wheezes. She has no rales. She exhibits no tenderness.  Abdominal: Soft. Bowel sounds are normal. She exhibits no distension and no mass. There is tenderness (nonspecifc). There is no rebound and no guarding.  Genitourinary: Vagina normal. There is no rash on the right labia. There is no rash on the left labia. Uterus is enlarged and tender. Cervix exhibits no motion tenderness, no discharge and no friability. Right adnexum displays no mass and no tenderness. Left adnexum displays no mass and no tenderness.  Neurological: She is alert and oriented to person, place, and time.  Skin: Skin is warm and dry. No erythema.  uterus irregular with anterior fibroid, tender No bulging of perineum with valsalva, no cystocele or rectocele     Assessment:     Nonspecific pelvic pain Lung nodule     Plan:     Last u/s 3y ago, reviewed, will repeat due to tenderness and bloating Need to follow up with PCP regarding lung nodule F/u with derm as planned  Call GI md to discuss tapering off suppositories suspect dependance

## 2013-01-15 NOTE — Telephone Encounter (Signed)
Patient needs to speak with nurse re: "Severe fatigue, abdominal bloating, and back pain like fire?" Patient was seen in the ER over the weekend and has an appointment with Dr. Hyacinth Meeker this Thursday for AEX. Please call patient to be sure she should not need to be seen sooner?

## 2013-01-16 ENCOUNTER — Telehealth: Payer: Self-pay | Admitting: Neurology

## 2013-01-16 NOTE — Telephone Encounter (Signed)
Relayed Dr Marlis Edelson agreement to order a spinal tap.  I informed the pt that they should be contacted by the group performing the procedure.

## 2013-01-17 NOTE — Telephone Encounter (Signed)
Patient calling to discuss some chest xray from February 2014 please.  Also wants to check on status of ultrasound pre-cert.

## 2013-01-17 NOTE — Telephone Encounter (Signed)
Patient informed of ultrasound pre cert will be done by Eber Jones and will expect call from her regarding pre  Cert. Patient wanting to know of chest x-ray result of 06/2012 to compare with a xray done recently that Dr. Farrel Gobble mentioned to her the result of .  Please advise.

## 2013-01-18 ENCOUNTER — Ambulatory Visit (INDEPENDENT_AMBULATORY_CARE_PROVIDER_SITE_OTHER): Payer: BC Managed Care – PPO | Admitting: Obstetrics & Gynecology

## 2013-01-18 ENCOUNTER — Telehealth: Payer: Self-pay | Admitting: Neurology

## 2013-01-18 DIAGNOSIS — N949 Unspecified condition associated with female genital organs and menstrual cycle: Secondary | ICD-10-CM

## 2013-01-18 DIAGNOSIS — R141 Gas pain: Secondary | ICD-10-CM

## 2013-01-18 DIAGNOSIS — R143 Flatulence: Secondary | ICD-10-CM

## 2013-01-18 DIAGNOSIS — D259 Leiomyoma of uterus, unspecified: Secondary | ICD-10-CM

## 2013-01-18 NOTE — Telephone Encounter (Signed)
i saw the patient last in June 2013 hence will need to see her again and re evaluate the need for spinal tap

## 2013-01-18 NOTE — Telephone Encounter (Signed)
Patient has been precerted and scheduled.

## 2013-01-18 NOTE — Telephone Encounter (Signed)
I called the patient and gave her a revisit appointment with Kayla Harris. Dr. Pearlean Brownie will see her then.

## 2013-01-24 NOTE — Telephone Encounter (Signed)
Patient notified for her to keep appointment tomorrow for PUS. Even though she is on her cycle.

## 2013-01-24 NOTE — Telephone Encounter (Signed)
Patient has a pus appointment tomorrow and has stared her cycle, is this ok?

## 2013-01-25 ENCOUNTER — Ambulatory Visit (INDEPENDENT_AMBULATORY_CARE_PROVIDER_SITE_OTHER): Payer: BC Managed Care – PPO

## 2013-01-25 ENCOUNTER — Other Ambulatory Visit: Payer: Self-pay | Admitting: Orthopedic Surgery

## 2013-01-25 ENCOUNTER — Telehealth: Payer: Self-pay | Admitting: Orthopedic Surgery

## 2013-01-25 ENCOUNTER — Ambulatory Visit (INDEPENDENT_AMBULATORY_CARE_PROVIDER_SITE_OTHER): Payer: BC Managed Care – PPO | Admitting: Obstetrics & Gynecology

## 2013-01-25 VITALS — BP 116/78 | Ht 62.25 in | Wt 130.4 lb

## 2013-01-25 DIAGNOSIS — N949 Unspecified condition associated with female genital organs and menstrual cycle: Secondary | ICD-10-CM

## 2013-01-25 DIAGNOSIS — D259 Leiomyoma of uterus, unspecified: Secondary | ICD-10-CM

## 2013-01-25 DIAGNOSIS — D219 Benign neoplasm of connective and other soft tissue, unspecified: Secondary | ICD-10-CM

## 2013-01-25 DIAGNOSIS — N920 Excessive and frequent menstruation with regular cycle: Secondary | ICD-10-CM

## 2013-01-25 DIAGNOSIS — R1011 Right upper quadrant pain: Secondary | ICD-10-CM

## 2013-01-25 DIAGNOSIS — Z862 Personal history of diseases of the blood and blood-forming organs and certain disorders involving the immune mechanism: Secondary | ICD-10-CM

## 2013-01-25 LAB — HEPATIC FUNCTION PANEL
ALT: 42 U/L — ABNORMAL HIGH (ref 0–35)
Bilirubin, Direct: 0.1 mg/dL (ref 0.0–0.3)
Indirect Bilirubin: 0.3 mg/dL (ref 0.0–0.9)
Total Bilirubin: 0.4 mg/dL (ref 0.3–1.2)

## 2013-01-25 NOTE — Progress Notes (Signed)
47 y.o.Divorcedfemale here for a pelvic ultrasound due to hx of fibroids, recent issues with anemia and menorrhagia.  She has declined treatment for fibroids in the past.  She really doesn't want surgery.  Saw Dr. Farrel Gobble on 01/15/13.  At that visit, she complained of both abdominal bloating and vaginal pressure.  She is worried she has a liver problem as well.  She reports what feels like shortness of breath to her.  She did have an evaluation including troponin, EKG, CT to r/o PE.  Also reported she had been using suppositories for years to induce BMs but she has stopped this and is doing ok from bowel movement standpoint.  Both parents with hx of colon cancer.  Patient and I have discussed Lynch testing in the past.  She is very anxious that she has cancer that just hasn't been detected yet.  Ready to proceed with genetic testing.  Aware of need for increased screening for colon cancer, if positive.    Patient's last menstrual period was 01/24/2013.  FINDINGS: UTERUS: 12.3 x 6.4 x 5.8cm (242 cm3) with 2 fibroids 2.9 and 4.8cm (comparison u/s 7/11 uterus was 11.5 x 5.0 x 6.4cm with 3.7 and 1.7cm fibroids) EMS: 6.46mm ADNEXA:   Left ovary 2.0 x 1.8 x 1.8cm   Right ovary 1.6 x 1.9 x 2.6cm with 13mm corpus luteal cyst CUL DE SAC: no free fluid noted  Assessment:  Overall increase in size of fibroids and uterus.  Patient declines surgery.  More worried about GI health.  Plan: CMP, abd u/s. Proceed with genetic testing.    ~25 minutes spent with patient >50% of time was in face to face discussion of above.

## 2013-01-25 NOTE — Telephone Encounter (Signed)
Advised pt of appt at [facility] 01-31-13 arriving at 9:15 for a 9:30 appt. [address] location. Phone number given to reschedule. Advised pt to not eat or drink past MN the night before appt. Pt agreeable.

## 2013-01-26 ENCOUNTER — Encounter: Payer: Self-pay | Admitting: Obstetrics & Gynecology

## 2013-01-30 ENCOUNTER — Encounter: Payer: Self-pay | Admitting: Obstetrics & Gynecology

## 2013-01-31 ENCOUNTER — Other Ambulatory Visit: Payer: Self-pay | Admitting: Obstetrics & Gynecology

## 2013-01-31 ENCOUNTER — Ambulatory Visit
Admission: RE | Admit: 2013-01-31 | Discharge: 2013-01-31 | Disposition: A | Discharge status: Home / Self Care | Payer: BC Managed Care – PPO | Source: Ambulatory Visit | Attending: Obstetrics & Gynecology | Admitting: Obstetrics & Gynecology

## 2013-01-31 ENCOUNTER — Telehealth: Payer: Self-pay | Admitting: *Deleted

## 2013-01-31 DIAGNOSIS — R1011 Right upper quadrant pain: Secondary | ICD-10-CM

## 2013-01-31 NOTE — Telephone Encounter (Signed)
Message copied by Alisa Graff on Wed Jan 31, 2013  2:25 PM ------      Message from: Jerene Bears      Created: Wed Jan 31, 2013  2:10 PM       Inform ultrasound of abdomen was normal.  Did a CMP day of office visit.  This showed liver enzymes mildly elevated.  This needs to be watched or she could follow-up with GI.  Also let her know we are having to do the letter for the Colaris testing. ------

## 2013-01-31 NOTE — Telephone Encounter (Signed)
Patient notified of results per Dr Royston Cowper instruction.  Patient prefers to proceed with GI referral. Advised we recommend Dr Loreta Ave and patient reports she is already a patient there.  She will call Dr Kenna Gilbert office to schedule appointment and we will send labs and ultrasound and let them know to expect her call.  Updated on Colaris testing.   LMN ready and on your desk.

## 2013-02-01 ENCOUNTER — Encounter: Payer: Self-pay | Admitting: Nurse Practitioner

## 2013-02-01 ENCOUNTER — Other Ambulatory Visit: Payer: Self-pay | Admitting: Gastroenterology

## 2013-02-01 ENCOUNTER — Telehealth: Payer: Self-pay | Admitting: *Deleted

## 2013-02-01 DIAGNOSIS — R1013 Epigastric pain: Secondary | ICD-10-CM

## 2013-02-01 NOTE — Telephone Encounter (Signed)
Order corrected at [facility].

## 2013-02-01 NOTE — Telephone Encounter (Signed)
Technician for [facility] called for chang of order for patient [name]. 01/31/2013 Patient stated to technologist that Dr. Hyacinth Meeker was wanting to check pancreas area also. [name] will only view liver and gallbladder. Requesting order change for Ultrasound of abdomen. Per Billie Ruddy order obtained from Dr. Edward Jolly for Ultrasound of Abdomen. 01/31/2013

## 2013-02-01 NOTE — Patient Instructions (Signed)
Please call if you have any changes or new issues/problems.

## 2013-02-01 NOTE — Telephone Encounter (Signed)
I agree with the abdominal ultrasound.

## 2013-02-02 ENCOUNTER — Encounter: Payer: Self-pay | Admitting: Nurse Practitioner

## 2013-02-02 ENCOUNTER — Ambulatory Visit (INDEPENDENT_AMBULATORY_CARE_PROVIDER_SITE_OTHER): Payer: BC Managed Care – PPO | Admitting: Nurse Practitioner

## 2013-02-02 VITALS — BP 119/72 | HR 60 | Ht 63.5 in | Wt 131.0 lb

## 2013-02-02 DIAGNOSIS — M2550 Pain in unspecified joint: Secondary | ICD-10-CM

## 2013-02-02 DIAGNOSIS — R5381 Other malaise: Secondary | ICD-10-CM

## 2013-02-02 DIAGNOSIS — R200 Anesthesia of skin: Secondary | ICD-10-CM

## 2013-02-02 DIAGNOSIS — R531 Weakness: Secondary | ICD-10-CM

## 2013-02-02 DIAGNOSIS — R209 Unspecified disturbances of skin sensation: Secondary | ICD-10-CM

## 2013-02-02 DIAGNOSIS — R5383 Other fatigue: Secondary | ICD-10-CM

## 2013-02-02 NOTE — Patient Instructions (Addendum)
Lumbar Puncture and Cerebrospinal Fluid Exam This is a procedure in which a small needle is put into the lower portion of the back and a small amount of clear fluid is removed. This is the fluid that surrounds the brain and spinal cord. A pressure is also taken of the spinal fluid. Your caregiver looks at the spinal fluid to determine whether it looks normal. It is then examined under a microscope to search for anything that may look unusual. Some of the unusual things that can be found would be blood in the spinal fluid or an elevation of white blood cells. It also tells what type of cells are present which would help determine if there could be encephalitis or meningitis, and whether the infection would be caused by a germ or a virus. It also reveals other blood chemistries which could indicate other problems present.  PREPARATION FOR TEST No preparation or fasting is necessary. NORMAL FINDINGS  Pressure: less than 20 cm H2O  Color: clear and odorless  Blood: none  Cells: RBC: O  WBC: Total  Neonate: 0-30 cells/ l  1-5 years: 0-20 cells/l  6-18 years: 0-10 cells/l  Adult: 0-5 cells/l  Differential  Neutrophils: 0%-6%  Lymphocytes: 40%-80%  Monocytes: 15%-45%  Culture and sensitivity: no organisms present  Protein: 15-45 mg/dl CSF (up to 70 mg/dl in elderly, adults and children)  Protein electrophoresis  Prealbumin: 2%-7%  Albumin: 56%-76%  Alpha1 globulin: 4%-12%  Alpha2 globulin: 4%-12%  Beta globulin: 8%-18%  Gamma globulin: 3%-12%  Oligoclonal bands: none  IgG: 0-4.5 mg/dl  Glucose: 16-10 mg/dl CSF or 96% to 04% of blood glucose level  Chloride: 700-750 mg/dl  Lactate dehydrogenase (LDH): less than 2-7.2 U/ml  Lactic acid: 10-25 mg/dl  Cytology: no malignant cells  Serology for syphilis: negative  Glutamine: 6-15 mg/dl Ranges for normal findings may vary among different laboratories and hospitals. You should always check with your doctor  after having lab work or other tests done to discuss the meaning of your test results and whether your values are considered within normal limits. MEANING OF TEST  Your caregiver will go over the test results with you and discuss the importance and meaning of your results, as well as treatment options and the need for additional tests if necessary. OBTAINING THE TEST RESULTS It is your responsibility to obtain your test results. Ask the lab or department performing the test when and how you will get your results. Document Released: 07/10/2004 Document Revised: 08/30/2011 Document Reviewed: 05/18/2008 [facility] Patient Information 2014 [place], [place].

## 2013-02-02 NOTE — Progress Notes (Signed)
[facility]  PATIENT: Kayla Harris DOB: June 29, 1965   HISTORY FROM: patient, chart REASON FOR VISIT: follow up ? MS   HISTORICAL  CHIEF COMPLAINT:  Chief Complaint  Patient presents with  . Follow-up    RV#[id number]    HISTORY OF PRESENT ILLNESS: 47 year lady with multifocal symptoms of intermittent transient dizziness, paresthesias, fatigue, frequent urination and blurred vision of unclear etiology. Demyelinating disease is certainly a possibility given her young age and previous known abnormal MRI. 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 She returns for followup after her last visit with me  on 09/30/2011. She continues to have intermmittent positional dizziness but has not  kkept her appointment with vestibular rehabilitation were learnt to do any dizziness exercises. She had blood work done on 09/30/11 which she was negative for Lyme disease, angiotensin coonverting enzyme, and the current and antibodies and ANA panel. Brainstem and visual evoked potentials done on 10/06/11 were both normal.  MM[facility] scan of the brain dated 10/06/11 shows multiple periatrial, periventricular and a few subcortical white matter hyperintensities which appear to be progressed compared with previous MRI report from 2004. MRI scan of the thoracic spine was normal. MRI scan of the cervical spine showed left lateral disc protrusion at C4-5 but without significant compression. No demyelinating lesions are noted. She continues to have multifocal symptoms and is worried about diagnosis of multiple sclerosis.  I had a long discussion with her with and the negative supporting imaging and lab findings. She is willing to consider a spinal tap to test for oligoclonal bands but wants from time to think about this.  UPDATE 02/02/13 (LL): Patient comes in for follow up.  Since last visit, she has had continued problems with joint pain, alterations in skin sensation (skin on back feels like severe  sunburn), blurred vision, dizziness, and fatigue.  Since last visit she has had benign left breast lump removed, melanoma removed, and chest tightness/shortness of breath; cardiac enzymes and ekg negative.  She wants to proceed with lumbar puncture.  Review of Systems  Out of a complete 14 system review, the patient complains of only the following symptoms, and all other reviewed systems are negative.  Constitutional: Fatigue   Eyes: Blurred vision      Respiratory: Short of Breath   Neurological:      Headache   Numbness   Weakness  Dizziness       Musculoskeletal: Joint pain   Aching muscles   Sleep:  Insomnia     Restless legs  ALLERGIES: Allergies  Allergen Reactions  . Dilaudid [Hydromorphone Hcl] Itching  . Codeine Itching  . Imitrex [Sumatriptan]     Developed chest pain   . Penicillins     HOME MEDICATIONS: Outpatient Prescriptions Prior to Visit  Medication Sig Dispense Refill  . acetaminophen (TYLENOL) 500 MG tablet Take 1,000 mg by mouth every 6 (six) hours as needed for pain.      . diphenhydrAMINE (BENADRYL) 25 MG tablet Take 25 mg by mouth every 6 (six) hours as needed for itching.      Marland Kitchen HYDROcodone-acetaminophen (NORCO/VICODIN) 5-325 MG per tablet Take 1 tablet by mouth every 6 (six) hours as needed for pain.  20 tablet  0  . Ibuprofen-Diphenhydramine Cit (MOTRIN PM) 200-38 MG TABS Take by mouth.      Marland Kitchen LORazepam (ATIVAN) 1 MG tablet Take one every 8 hours as needed for chest tightness or stress  20 tablet  0  . Naproxen Sodium (ALEVE  PO) Take by mouth.      . Pseudoephedrine-Acetaminophen (TYLENOL SINUS MAX ST PO) Take by mouth.       No facility-administered medications prior to visit.    PAST MEDICAL HISTORY: Past Medical History  Diagnosis Date  . Migraines   . Hernia   . Anemia   . Arthritis   . Hypertension   . Neuromuscular disorder   . Asthma     hx  . Melanoma     X3  . PONV (postoperative nausea and vomiting)   . Melanoma 2000-2005    x3  .  History of hemorrhoids     PAST SURGICAL HISTORY: Past Surgical History  Procedure Laterality Date  . Hernia repair    . Tubal reversal    . Melanoma excision      X3  . Breast surgery      "uplift"  . Breast surgery  2002    lt lump-negative  . Breast biopsy Left 08/22/2012    Procedure: Needle localization removal left breast mass;  Surgeon: Currie Paris, MD;  Location: [facility];  Service: General;  Laterality: Left;  Needle localization removal left breast mass  . Tubal ligation  in 20's    And reverse tubal ligation    FAMILY HISTORY: Family History  Problem Relation Age of Onset  . Colon cancer Mother   . Cancer Mother     Colon  . Colon cancer Father   . Cancer Father     Colon  . Cancer Paternal Aunt     Breast    SOCIAL HISTORY: History   Social History  . Marital Status: Divorced    Spouse Name: N/A    Number of Children: N/A  . Years of Education: N/A   Occupational History  . Not on file.   Social History Main Topics  . Smoking status: Never Smoker   . Smokeless tobacco: Never Used  . Alcohol Use: No  . Drug Use: No  . Sexual Activity: Not on file   Other Topics Concern  . Not on file   Social History Narrative  . No narrative on file     PHYSICAL EXAM  Filed Vitals:   02/02/13 1504  BP: 119/72  Pulse: 60  Height: 5' 3.5" (1.613 m)  Weight: 131 lb (59.421 kg)   Body mass index is 22.84 kg/(m^2).  Generalized: In no acute distress   Neck: Supple, no carotid bruits   Cardiac: Regular rate rhythm, no murmur   Pulmonary: Clear to auscultation bilaterally   Musculoskeletal: No deformity   Neurological examination   Neurologic Exam  Mental Status: Awake, alert and oriented to time, place and person.  Speech and language appear normal.   Cranial Nerves: Eye movements are full range without nystagmus.  Fundi not examined.  Visual fields are full to confrontational testing.  Face is symmetric without weakness.   Tongue is midline. Hearing is normal. Motor: reveals no upper or lower extremity drift.  Symmetric and equal strength in all four extremities.  No focal weakness. Sensory: Touch and pinprick sensations are normal.   Coordination: normal.   Gait and Station: steady gait including tandem walking Reflexes: Deep tendon reflexes are 3+ symmetric  and brisk.  Plantars are downgoing.    DIAGNOSTIC DATA (LABS, IMAGING, TESTING) - I reviewed patient records, labs, notes, testing and imaging myself where available.  Lab Results  Component Value Date   WBC 8.0 01/02/2013   HGB 12.2 01/02/2013  HCT 34.0* 01/02/2013   MCV 84.8 01/02/2013   PLT 254 01/02/2013      Component Value Date/Time   NA 139 01/02/2013 1717   K 3.3* 01/02/2013 1717   CL 102 01/02/2013 1717   CO2 27 01/02/2013 1717   GLUCOSE 96 01/02/2013 1717   BUN 14 01/02/2013 1717   CREATININE 0.65 01/02/2013 1717   CALCIUM 9.8 01/02/2013 1717   PROT 7.4 01/25/2013 1345   ALBUMIN 4.5 01/25/2013 1345   AST 38* 01/25/2013 1345   ALT 42* 01/25/2013 1345   ALKPHOS 84 01/25/2013 1345   BILITOT 0.4 01/25/2013 1345   GFRNONAA >90 01/02/2013 1717   GFRAA >90 01/02/2013 1717    ACUTE ABDOMEN SERIES (ABDOMEN 2 VIEW & CHEST 1 VIEW) 01/02/13 Negative for acute cardiopulmonary disease. Nonspecific bowel gas pattern. No high-grade bowel obstruction suspected. No pneumoperitoneum. 1 cm left upper lung nodule, most likely pleural parenchymal scarring. Recommend CT confirmation or radiographic follow up on an outpatient basis. CT ANGIOGRAPHY CHEST 01/02/13 No evidence of pulmonary embolism. ABDOMINAL ULTRASOUND COMPLETE 01/31/13  No abnormalities seen in the abdomen.  ASSESSMENT AND PLAN Ms. Kayla Harris is a 47 year old Caucasian lady with multifocal symptoms of intermittent transient dizziness, paresthesias, fatigue, joint pain, frequent urination and blurred vision of unclear etiology. Demyelinating disease is less likely now given negative spine imaging and evoked  potentials and work up for infectious/inflammatory causes is negative as well.    Plan : We will schedule spinal tap for oligoclonal bands for asap after Arlene (lab tech) returns.  Orders Placed This Encounter  Procedures  . Lumbar Puncture    Oneal Biglow NP-C 02/02/2013, 3:42 PM  [facility] [address] [place], [place] [address] [phone]

## 2013-02-07 ENCOUNTER — Telehealth: Payer: Self-pay | Admitting: Nurse Practitioner

## 2013-02-07 NOTE — Telephone Encounter (Signed)
Dr. Pearlean Brownie will be doing procedure,  Needs to be a time when he is available and Cira Rue is here.

## 2013-02-08 ENCOUNTER — Telehealth: Payer: Self-pay | Admitting: *Deleted

## 2013-02-08 NOTE — Telephone Encounter (Signed)
I spoke to pt and she would like to schedule the LP (for her work to know).  Planning purposes.  I told her we will check with Dr. Pearlean Brownie this afternoon and then let her know.

## 2013-02-08 NOTE — Telephone Encounter (Signed)
Message copied by Hermenia Fiscal on Thu Feb 08, 2013 10:47 AM ------      Message from: Seth Bake      Created: Thu Feb 08, 2013  9:05 AM      Contact: Patient       Calling again to schedule an LP.  Please call.  Would like to has this as soon as possible.   ------

## 2013-02-09 ENCOUNTER — Encounter (HOSPITAL_COMMUNITY)
Admission: RE | Admit: 2013-02-09 | Discharge: 2013-02-09 | Disposition: A | Discharge status: Home / Self Care | Payer: BC Managed Care – PPO | Source: Ambulatory Visit | Attending: Gastroenterology | Admitting: Gastroenterology

## 2013-02-09 DIAGNOSIS — R1013 Epigastric pain: Secondary | ICD-10-CM

## 2013-02-09 MED ORDER — TECHNETIUM TC 99M MEBROFENIN IV KIT
5.0000 | PACK | Freq: Once | INTRAVENOUS | Status: AC | PRN
  Administered 2013-02-09: 5 via INTRAVENOUS

## 2013-02-09 NOTE — Telephone Encounter (Signed)
Spoke with pt and relayed:  Procedure has been ordered.  If she has not been contact/scheduled by 02/16/2013, she should notify us.

## 2013-02-09 NOTE — Telephone Encounter (Signed)
Called patient and scheduled a appointment for her to have her LP done on 02-16-2013.

## 2013-02-13 ENCOUNTER — Telehealth: Payer: Self-pay | Admitting: *Deleted

## 2013-02-13 NOTE — Telephone Encounter (Signed)
Call to patient with test results from [facility].  LMTCB.

## 2013-02-14 NOTE — Telephone Encounter (Signed)
Patient returned my call at 1645 on 02-13-13 and was notified of results and that copy would be mailed to her.

## 2013-02-16 ENCOUNTER — Encounter: Payer: Self-pay | Admitting: Neurology

## 2013-02-16 ENCOUNTER — Ambulatory Visit (INDEPENDENT_AMBULATORY_CARE_PROVIDER_SITE_OTHER): Payer: BC Managed Care – PPO | Admitting: Neurology

## 2013-02-16 VITALS — BP 136/79 | HR 57 | Ht 63.5 in | Wt 134.0 lb

## 2013-02-16 DIAGNOSIS — R209 Unspecified disturbances of skin sensation: Secondary | ICD-10-CM

## 2013-02-16 DIAGNOSIS — R200 Anesthesia of skin: Secondary | ICD-10-CM

## 2013-02-16 DIAGNOSIS — M25529 Pain in unspecified elbow: Secondary | ICD-10-CM

## 2013-02-16 DIAGNOSIS — G35 Multiple sclerosis: Secondary | ICD-10-CM

## 2013-02-16 NOTE — Addendum Note (Signed)
Addended by: Salome Spotted on: 02/16/2013 03:02 PM   Modules accepted: Orders

## 2013-02-16 NOTE — Progress Notes (Signed)
Procedure Note       Lumbar Puncture Date 02/15/13 Patient ; Kayla Harris   dob 05/22/1966  Mr # [id number]  Indication: Diagnostic for suspected multiple sclerosis  I explained to the patient at length the indications, risks and benefits and answered questions prior to starting the procedure. The complications including spinal headache, bleeding, infection, discomfort were discussed in detail. After written informed consent the procedure was done at the bedside. The patient was positioned in the left lateral decubitus position. The fascia over the lower back and lumbar region was cleaned using Betadine x3 and and draped in a sterile fashion. 3 cc of 1% lidocaine was used for Loken and fenestration of other skin and deep tissues. Initial attempt at L3-4 interspace was unsuccessful and the procedure was eventually done at the L4-5 interspace. A 21-gauge spinal needle was introduced and the spinal fluid obtained was on the low pressure. There were a few drops of blood initially and subsequently the fluid cleared. The CSF was coming out the next in the lower pressure and only 4 cc were collected in one Q. The patient did not consent 2 needle repositionings and hence the procedure was stopped. The fluid was sent for chemical analysis of oligoclonal bands, IgG index, protein, glucose and cell count. A sterile Band-Aid was used to cover the site of the procedure in a sterile fashion. Patient was advised bedrest for a few hours. She was also advised to drink plenty of fluids and caffeine and rest until she got headache over the next few days. No immediate complications were noted.

## 2013-02-20 ENCOUNTER — Telehealth: Payer: Self-pay | Admitting: Neurology

## 2013-02-20 LAB — IGG CSF INDEX
Albumin CSF-mCnc: 33 mg/dL (ref 11–48)
Albumin: 4.4 g/dL (ref 3.5–5.5)
IgG (Immunoglobin G), Serum: 1197 mg/dL (ref 700–1600)
IgG Index, CSF: 0.4 (ref 0.0–0.7)

## 2013-02-20 LAB — GLUCOSE, CEREBROSPINAL FLUID: Glucose, CSF: 60 mg/dL (ref 40–70)

## 2013-02-21 NOTE — Telephone Encounter (Signed)
Called patient to in form her that her results are here, once Dr.Sethi reads them I will call with those results.

## 2013-02-21 NOTE — Telephone Encounter (Signed)
Called patient and let her know her results were normal.

## 2013-02-23 ENCOUNTER — Telehealth: Payer: Self-pay | Admitting: Neurology

## 2013-02-23 NOTE — Telephone Encounter (Signed)
Mailed lab results to patient and forwarded referral to renee to review.

## 2013-02-23 NOTE — Telephone Encounter (Signed)
Patient requesting to know why MRI ordered. Explained reason for MRI per notes. Patient agreed.

## 2013-02-26 ENCOUNTER — Telehealth: Payer: Self-pay | Admitting: Obstetrics & Gynecology

## 2013-02-26 NOTE — Telephone Encounter (Signed)
Patient states she wants to have hysterectomy due to fibroids and heavy periods and PMS.  Wants surgery by end of year.  Has had all other testing and arm and shoulder pain is thought to be related to disc in her neck and she will see chiropractor for this. At last visit, was not interested in surgery advised she may need to come in to see Dr Hyacinth Meeker before scheduling. Patient states she really does not want to come back in because she has missed so much time for other tests.  Advised I may be able to schedule surgery but will have to have at least one visit for preop.  Will check with MD for scheduling information.

## 2013-02-26 NOTE — Telephone Encounter (Signed)
Patient is ready to schedule surgery. Please call.

## 2013-02-26 NOTE — Telephone Encounter (Signed)
Kennon Rounds,   Do you know anything about this surgery? I do not have anything in regards to this surgery.

## 2013-02-27 NOTE — Telephone Encounter (Signed)
LMTCB with date preferences.

## 2013-02-27 NOTE — Telephone Encounter (Signed)
Ok to schedule.

## 2013-03-09 NOTE — Telephone Encounter (Signed)
Call to patient and LM that I received her info to cancel surgery.  Patient had previously been very insistent to get this done before end of year.  LM that once canceled, this date would not become available again as there is already a wait list for this spot.  Certainly fine to cancel, just want to be sure before giving up her spot. LMTCB

## 2013-03-09 NOTE — Telephone Encounter (Signed)
Patient called to say that she does not want to have surgery at this time. She has sever other things going on and may have to have surgery on her neck. She does want to have it but not sure when it will be. She will call back when she decides.

## 2013-03-13 NOTE — Telephone Encounter (Signed)
Patient calls back to confirm that she wants to cancel the surgery that she requested for 05-15-13. Patient states she has too many other issues going on and has missed a lot of time from work.

## 2013-04-26 ENCOUNTER — Other Ambulatory Visit: Payer: Self-pay

## 2013-05-15 ENCOUNTER — Encounter (HOSPITAL_COMMUNITY): Admission: RE | Payer: Self-pay | Source: Ambulatory Visit

## 2013-05-15 ENCOUNTER — Ambulatory Visit (HOSPITAL_COMMUNITY)
Admission: RE | Admit: 2013-05-15 | Payer: BC Managed Care – PPO | Source: Ambulatory Visit | Admitting: Obstetrics & Gynecology

## 2013-05-15 SURGERY — ROBOTIC ASSISTED TOTAL HYSTERECTOMY
Anesthesia: General

## 2013-07-24 ENCOUNTER — Telehealth: Payer: Self-pay | Admitting: Obstetrics & Gynecology

## 2013-07-24 ENCOUNTER — Other Ambulatory Visit: Payer: Self-pay | Admitting: Obstetrics & Gynecology

## 2013-07-24 DIAGNOSIS — Z1231 Encounter for screening mammogram for malignant neoplasm of breast: Secondary | ICD-10-CM

## 2013-07-24 NOTE — Telephone Encounter (Signed)
PLEASE DISREGARD MADE IN ERROR/RD

## 2013-07-26 ENCOUNTER — Other Ambulatory Visit: Payer: Self-pay | Admitting: Obstetrics & Gynecology

## 2013-07-26 ENCOUNTER — Ambulatory Visit
Admission: RE | Admit: 2013-07-26 | Discharge: 2013-07-26 | Disposition: A | Discharge status: Home / Self Care | Payer: Self-pay | Source: Ambulatory Visit | Attending: Obstetrics & Gynecology | Admitting: Obstetrics & Gynecology

## 2013-07-26 DIAGNOSIS — Z1231 Encounter for screening mammogram for malignant neoplasm of breast: Secondary | ICD-10-CM

## 2013-07-27 ENCOUNTER — Encounter: Payer: Self-pay | Admitting: Obstetrics & Gynecology

## 2013-07-27 ENCOUNTER — Ambulatory Visit (INDEPENDENT_AMBULATORY_CARE_PROVIDER_SITE_OTHER): Payer: BC Managed Care – PPO | Admitting: Obstetrics & Gynecology

## 2013-07-27 ENCOUNTER — Telehealth: Payer: Self-pay | Admitting: Obstetrics & Gynecology

## 2013-07-27 VITALS — BP 104/64 | HR 58 | Resp 16 | Ht 63.0 in | Wt 139.6 lb

## 2013-07-27 DIAGNOSIS — Z23 Encounter for immunization: Secondary | ICD-10-CM

## 2013-07-27 DIAGNOSIS — Z124 Encounter for screening for malignant neoplasm of cervix: Secondary | ICD-10-CM

## 2013-07-27 DIAGNOSIS — Z0000 Encounter for general adult medical examination without abnormal findings: Secondary | ICD-10-CM

## 2013-07-27 DIAGNOSIS — Z01419 Encounter for gynecological examination (general) (routine) without abnormal findings: Secondary | ICD-10-CM

## 2013-07-27 LAB — HEPATIC FUNCTION PANEL
ALBUMIN: 4.5 g/dL (ref 3.5–5.2)
ALT: 20 U/L (ref 0–35)
AST: 19 U/L (ref 0–37)
Alkaline Phosphatase: 86 U/L (ref 39–117)
Bilirubin, Direct: 0.1 mg/dL (ref 0.0–0.3)
Indirect Bilirubin: 0.3 mg/dL (ref 0.2–1.2)
TOTAL PROTEIN: 7.3 g/dL (ref 6.0–8.3)
Total Bilirubin: 0.4 mg/dL (ref 0.2–1.2)

## 2013-07-27 LAB — COMPREHENSIVE METABOLIC PANEL
ALBUMIN: 4.5 g/dL (ref 3.5–5.2)
ALT: 20 U/L (ref 0–35)
AST: 19 U/L (ref 0–37)
Alkaline Phosphatase: 86 U/L (ref 39–117)
BUN: 12 mg/dL (ref 6–23)
CALCIUM: 9.5 mg/dL (ref 8.4–10.5)
CHLORIDE: 103 meq/L (ref 96–112)
CO2: 29 mEq/L (ref 19–32)
Creat: 0.67 mg/dL (ref 0.50–1.10)
Glucose, Bld: 93 mg/dL (ref 70–99)
POTASSIUM: 4.3 meq/L (ref 3.5–5.3)
SODIUM: 139 meq/L (ref 135–145)
TOTAL PROTEIN: 7.3 g/dL (ref 6.0–8.3)
Total Bilirubin: 0.4 mg/dL (ref 0.2–1.2)

## 2013-07-27 LAB — POCT URINALYSIS DIPSTICK
BILIRUBIN UA: NEGATIVE
GLUCOSE UA: NEGATIVE
Ketones, UA: NEGATIVE
Nitrite, UA: NEGATIVE
Protein, UA: NEGATIVE
Urobilinogen, UA: NEGATIVE
pH, UA: 5

## 2013-07-27 LAB — HEMOGLOBIN, FINGERSTICK: Hemoglobin, fingerstick: 12.9 g/dL (ref 12.0–16.0)

## 2013-07-27 LAB — SEDIMENTATION RATE: SED RATE: 4 mm/h (ref 0–22)

## 2013-07-27 NOTE — Telephone Encounter (Signed)
Contacted patient. I explained her benefits as quoted by [facility] and advised of patient liability for physicians fees for surgery. She has decided against the surgery for now.  Encounter closed.

## 2013-07-27 NOTE — Progress Notes (Signed)
48 y.o. [id number] DivorcedCaucasianF here for annual exam.  Sister in law died this week from breast cancer.  She was diagnosed with breast cancer at Thanksgiving.  Losing job at end of month due to her [employer] is retiring.  Patient offered job in [place] but this would be an hour drive.  Patient has had several lesions on skin removed.  Has had to have re-excisions of at least two.    Flow is still heavy.  Still contemplating a hysterectomy.    Patient's last menstrual period was 07/19/2013.          Sexually active: no  The current method of family planning is none.    Exercising: yes  swimming Smoker:  no  Health Maintenance: Pap:  01/04/12 WNL/negative HR HPV History of abnormal Pap:  yes MMG:  07/26/13 [facility]-no results in epic yet Colonoscopy:  2014-repeat in 3 years BMD:   none TDaP:  today Screening Labs: today, Hb today: 12.9, Urine today: WBC-trace, RBC-trace   reports that she has never smoked. She has never used smokeless tobacco. She reports that she does not drink alcohol or use illicit drugs.  Past Medical History  Diagnosis Date  . Migraines   . Hernia   . Anemia   . Arthritis   . Hypertension   . Neuromuscular disorder   . Asthma     hx  . Melanoma     X3  . PONV (postoperative nausea and vomiting)   . Melanoma 2000-2005    x3  . History of hemorrhoids     Past Surgical History  Procedure Laterality Date  . Hernia repair    . Tubal reversal    . Melanoma excision      X3  . Breast surgery      "uplift"  . Breast surgery  2002    lt lump-negative  . Breast biopsy Left 08/22/2012    Procedure: Needle localization removal left breast mass;  Surgeon: Haywood Lasso, MD;  Location: [facility];  Service: General;  Laterality: Left;  Needle localization removal left breast mass  . Tubal ligation  in 20's    And reverse tubal ligation    Current Outpatient Prescriptions  Medication Sig Dispense Refill  .  Pseudoeph-Doxylamine-DM-APAP (NYQUIL PO) Take by mouth. nightly       No current facility-administered medications for this visit.    Family History  Problem Relation Age of Onset  . Colon cancer Mother   . Cancer Mother     Colon  . Colon cancer Father   . Cancer Father     Colon  . Cancer Paternal Aunt 18    Breast    ROS:  Pertinent items are noted in HPI.  Otherwise, a comprehensive ROS was negative.  Exam:   BP 104/64  Pulse 58  Resp 16  Ht 5\' 3"  (1.6 m)  Wt 139 lb 9.6 oz (63.322 kg)  BMI 24.74 kg/m2  LMP 07/19/2013  Weight change:stable  Height: 5\' 3"  (160 cm)  Ht Readings from Last 3 Encounters:  07/27/13 5\' 3"  (1.6 m)  02/16/13 5' 3.5" (1.613 m)  02/02/13 5' 3.5" (1.613 m)    General appearance: alert, cooperative and appears stated age Head: Normocephalic, without obvious abnormality, atraumatic Neck: no adenopathy, supple, symmetrical, trachea midline and thyroid normal to inspection and palpation Lungs: clear to auscultation bilaterally Breasts: normal appearance, no masses or tenderness Heart: regular rate and rhythm Abdomen: soft, non-tender; bowel sounds  normal; no masses,  no organomegaly Extremities: extremities normal, atraumatic, no cyanosis or edema Skin: Skin color, texture, turgor normal. No rashes or lesions Lymph nodes: Cervical, supraclavicular, and axillary nodes normal. No abnormal inguinal nodes palpated Neurologic: Grossly normal   Pelvic: External genitalia:  no lesions              Urethra:  normal appearing urethra with no masses, tenderness or lesions              Bartholins and Skenes: normal                 Vagina: normal appearing vagina with normal color and discharge, no lesions              Cervix: no lesions              Pap taken: yes Bimanual Exam:  Uterus:  Enlarged about 10-12 weeks size, globular, mobile            Adnexa: normal adnexa and no mass, fullness, tenderness               Rectovaginal: Confirms                Anus:  normal sphincter tone, no lesions  A:  Well Woman with normal exam Strong family hx of colon cancer--mother and father deceased due to colon cancer H/O melanoma--sees derm regularly Enlarged uterus, fibroids on PUS.  Pt still interested in hysterectomy H/O lung nodule on CXR 01/02/13.  CT same day negative. Atypical neurologic symptoms.  Pt has started evaluation for MS but she is tired of testing at this time and has decided to let it wait for awhile.  P:   Mammogram done yesterday.  Did 3D pap smear today.  Neg HR HPV last year. Sed rate, CMP, liver panel Tdap today Colonoscopy every 3 years (last 2014 with Dr. Collene Mares) return annually or prn  An After Visit Summary was printed and given to the patient.

## 2013-07-27 NOTE — Patient Instructions (Signed)

## 2013-07-27 NOTE — Telephone Encounter (Signed)
Message copied by Laqueta Due on Fri Jul 27, 2013  3:00 PM ------      Message from: Megan Salon      Created: Fri Jul 27, 2013 12:39 PM      Regarding: RE: precert       Thank you for checking.  Can you let he know this?  She's not going to want to proceed right now but thank you, again, for checking.            MSM      ----- Message -----         From: Laqueta Due         Sent: 07/27/2013  12:12 PM           To: Lyman Speller, MD      Subject: Melton Alar: precert                                              Dr Sabra Heck,            Patient liability for surgery would be $1424.81. She has an $1150 deductible (36met), then her plan pays at 70/30.            Thank you,      Gabriel Cirri      ----- Message -----         From: Suzy Bouchard         Sent: 07/27/2013  10:42 AM           To: Laqueta Due      Subject: FW: precert                                              Would you mind doing this precert?      Janett Billow       ----- Message -----         From: Lyman Speller, MD         Sent: 07/27/2013  10:11 AM           To: Suzy Bouchard      Subject: precert                                                  Can you precet an LAVH for this patient?  Can you let me know before you call her?            Thanks.                   ------

## 2013-07-30 LAB — IPS PAP SMEAR ONLY

## 2013-08-02 ENCOUNTER — Other Ambulatory Visit: Payer: Self-pay | Admitting: Obstetrics & Gynecology

## 2013-08-02 DIAGNOSIS — R928 Other abnormal and inconclusive findings on diagnostic imaging of breast: Secondary | ICD-10-CM

## 2013-08-09 ENCOUNTER — Ambulatory Visit
Admission: RE | Admit: 2013-08-09 | Discharge: 2013-08-09 | Disposition: A | Discharge status: Home / Self Care | Payer: BC Managed Care – PPO | Source: Ambulatory Visit | Attending: Obstetrics & Gynecology | Admitting: Obstetrics & Gynecology

## 2013-08-09 DIAGNOSIS — R928 Other abnormal and inconclusive findings on diagnostic imaging of breast: Secondary | ICD-10-CM

## 2013-08-13 ENCOUNTER — Other Ambulatory Visit: Payer: BC Managed Care – PPO

## 2013-08-14 ENCOUNTER — Other Ambulatory Visit: Payer: BC Managed Care – PPO

## 2014-01-21 ENCOUNTER — Ambulatory Visit: Payer: BC Managed Care – PPO | Admitting: Obstetrics & Gynecology

## 2014-04-05 ENCOUNTER — Other Ambulatory Visit: Payer: Self-pay

## 2014-04-22 ENCOUNTER — Encounter: Payer: Self-pay | Admitting: Obstetrics & Gynecology

## 2014-09-02 ENCOUNTER — Other Ambulatory Visit: Payer: Self-pay

## 2014-09-02 DIAGNOSIS — Z1231 Encounter for screening mammogram for malignant neoplasm of breast: Secondary | ICD-10-CM

## 2014-09-24 ENCOUNTER — Other Ambulatory Visit: Payer: Self-pay

## 2014-09-24 ENCOUNTER — Ambulatory Visit
Admission: RE | Admit: 2014-09-24 | Discharge: 2014-09-24 | Disposition: A | Discharge status: Home / Self Care | Payer: BLUE CROSS/BLUE SHIELD | Source: Ambulatory Visit

## 2014-09-24 DIAGNOSIS — Z1231 Encounter for screening mammogram for malignant neoplasm of breast: Secondary | ICD-10-CM

## 2014-09-25 ENCOUNTER — Other Ambulatory Visit: Payer: Self-pay | Admitting: Obstetrics & Gynecology

## 2014-09-25 DIAGNOSIS — R928 Other abnormal and inconclusive findings on diagnostic imaging of breast: Secondary | ICD-10-CM

## 2014-09-27 ENCOUNTER — Ambulatory Visit
Admission: RE | Admit: 2014-09-27 | Discharge: 2014-09-27 | Disposition: A | Discharge status: Home / Self Care | Payer: BLUE CROSS/BLUE SHIELD | Source: Ambulatory Visit | Attending: Obstetrics & Gynecology | Admitting: Obstetrics & Gynecology

## 2014-09-27 DIAGNOSIS — R928 Other abnormal and inconclusive findings on diagnostic imaging of breast: Secondary | ICD-10-CM

## 2014-11-24 IMAGING — US US ABDOMEN COMPLETE
1 series · 14 of 25 positions shown · non-contrast
Comparison: None.

CLINICAL DATA: Right upper quadrant abdominal pain

ABDOMINAL ULTRASOUND COMPLETE

[Series 1: us abdomen complete · 0.21mm/px · 14 of 77 slices shown]
[im 1/77]
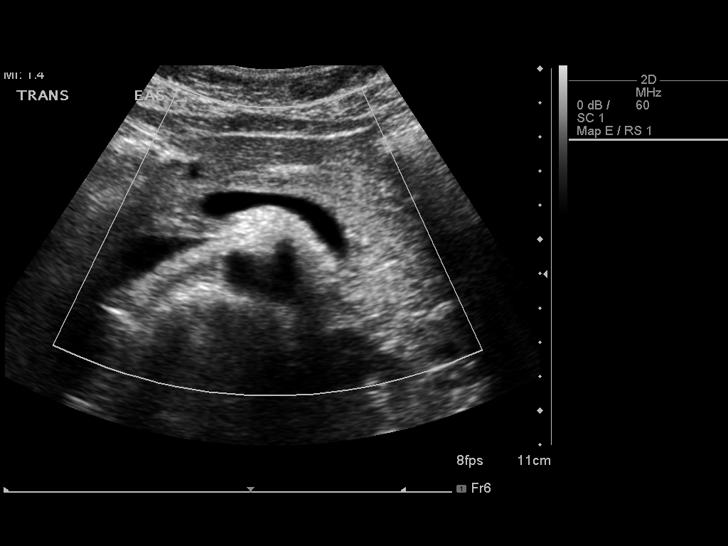
[im 7/77]
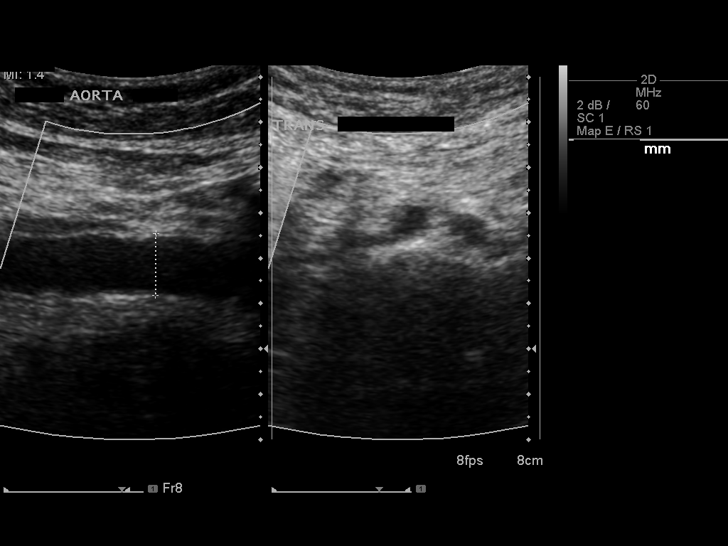
[im 13/77]
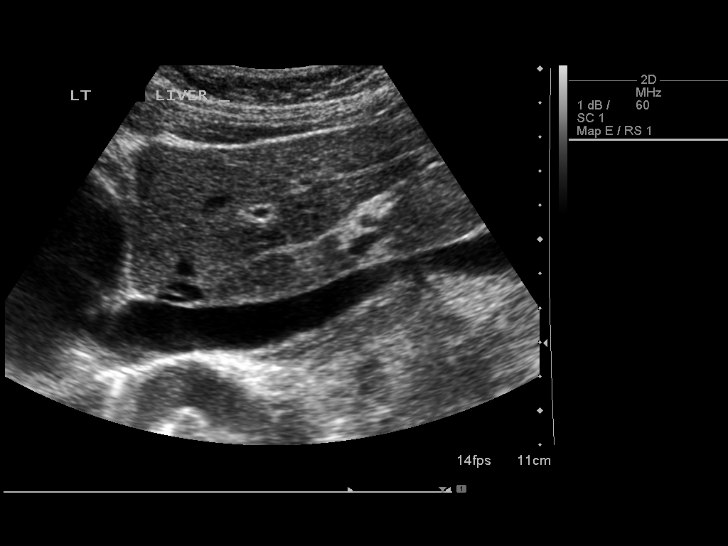
[im 20/77]
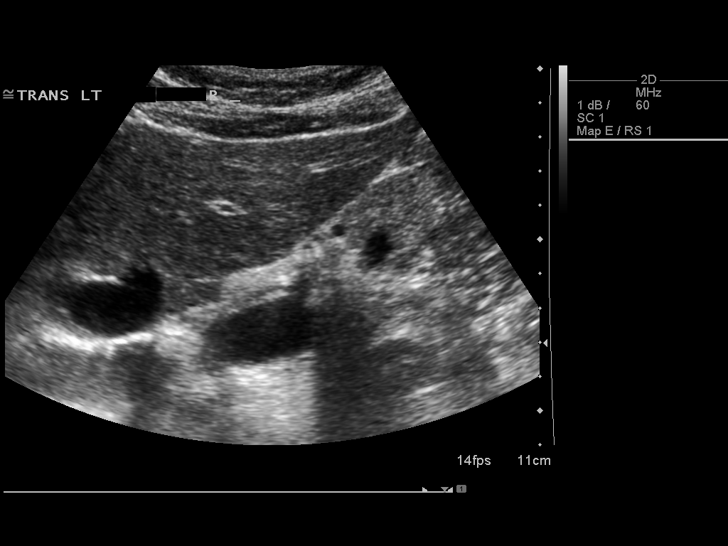
[im 26/77]
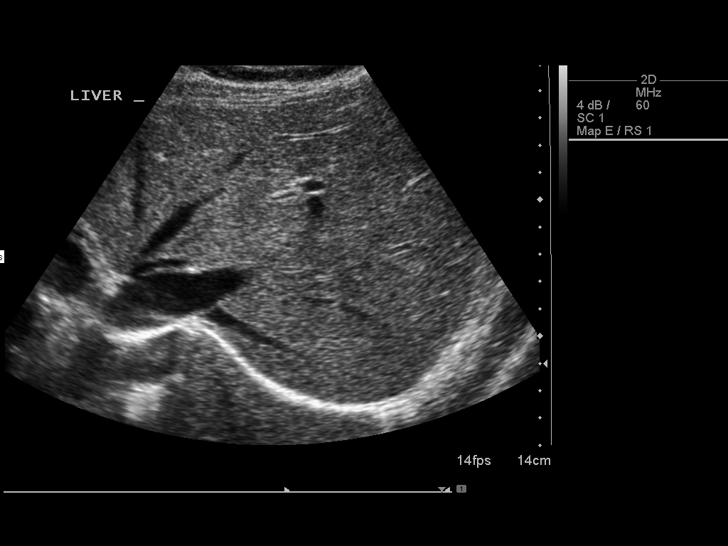
[im 29/77]
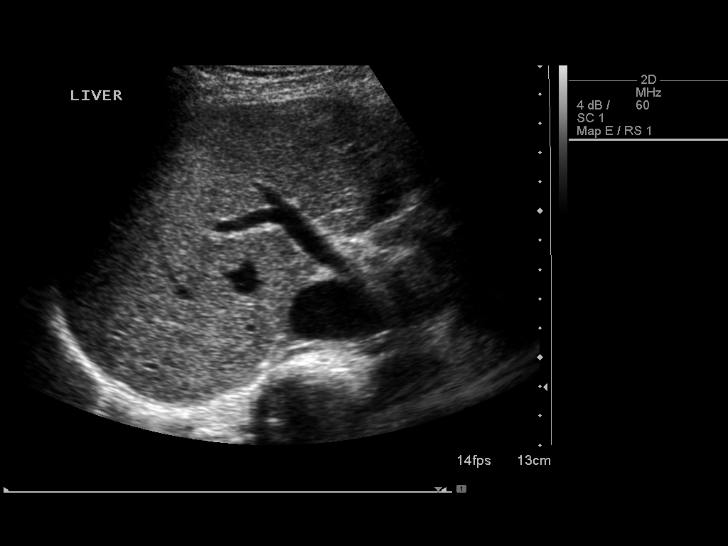
[im 35/77]
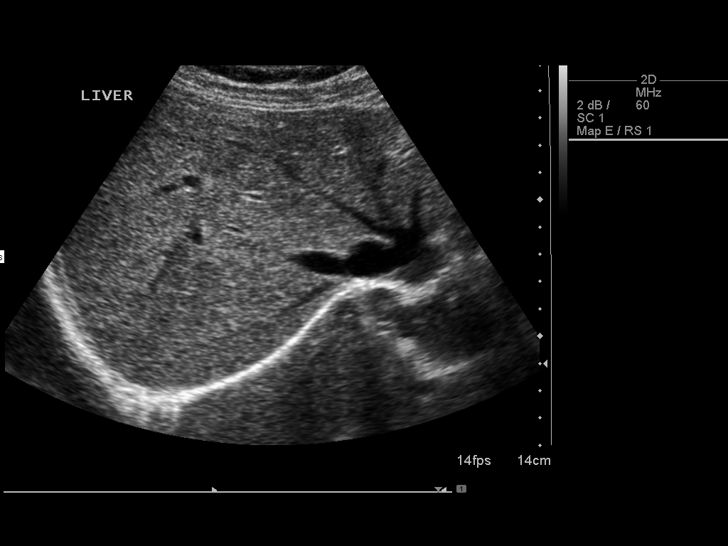
[im 42/77]
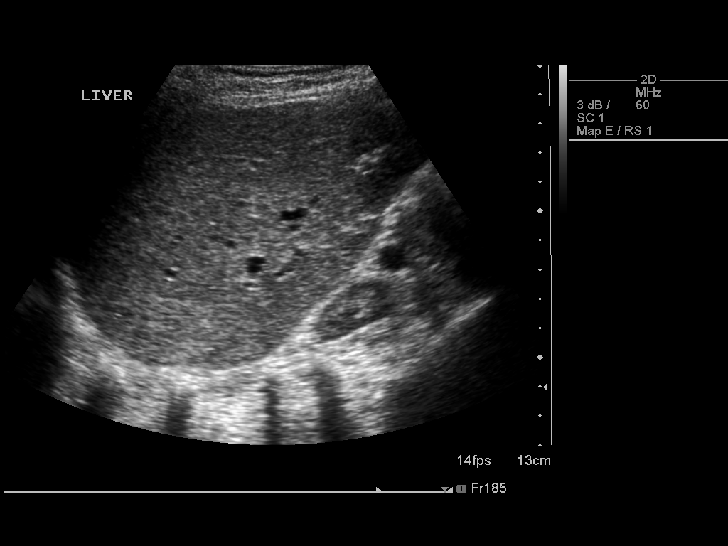
[im 48/77]
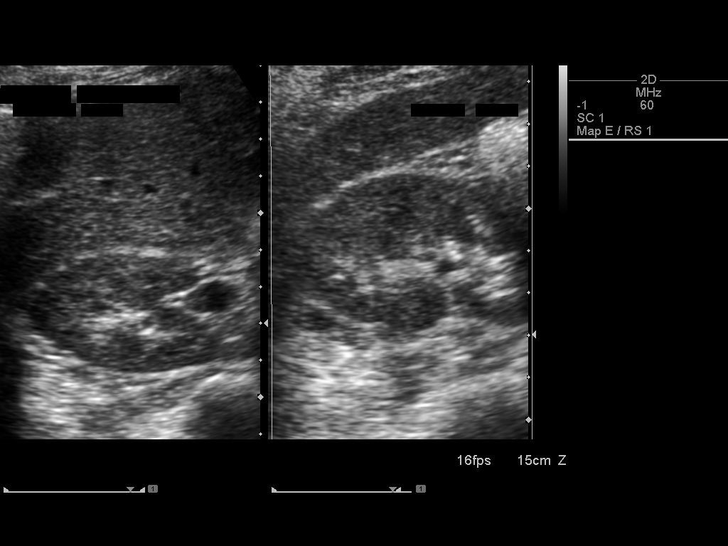
[im 51/77]
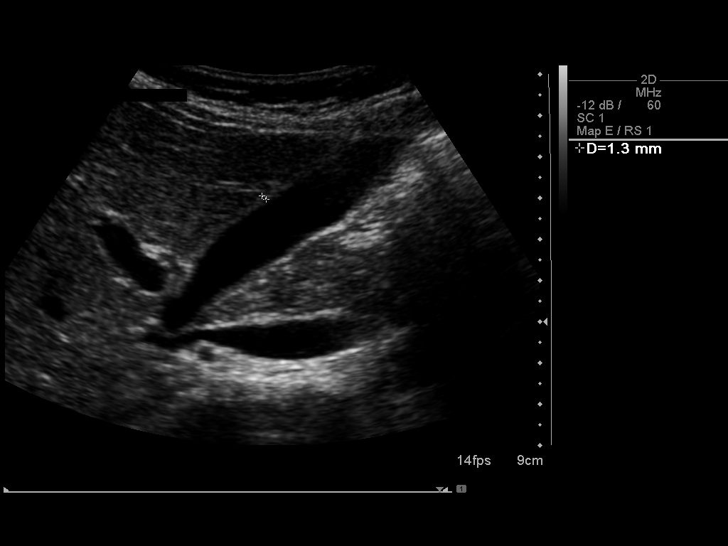
[im 58/77]
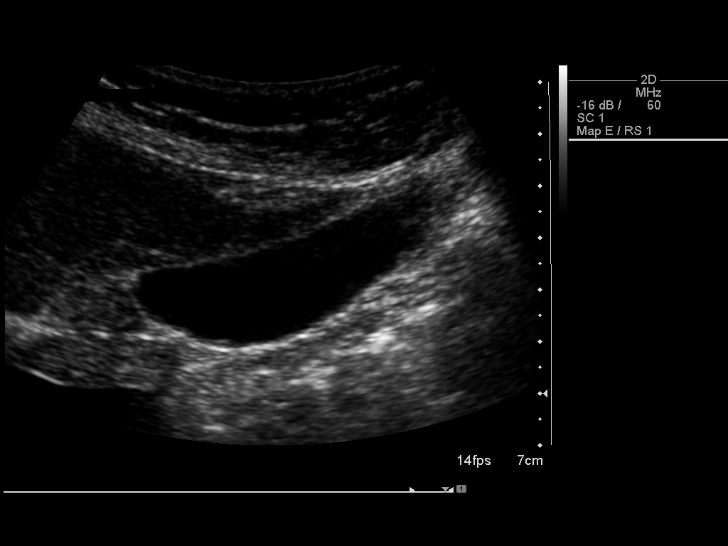
[im 64/77]
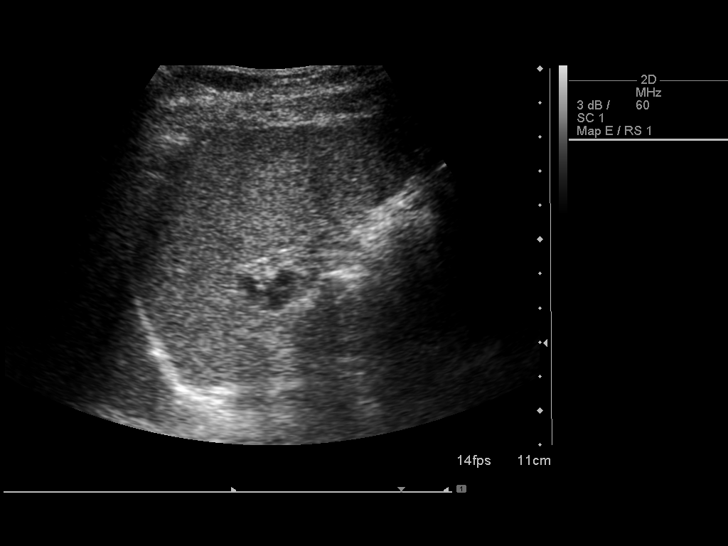
[im 70/77]
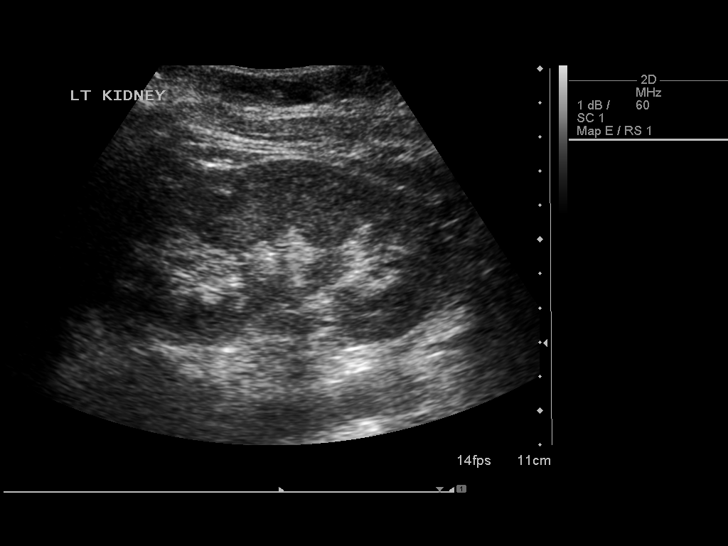
[im 77/77]
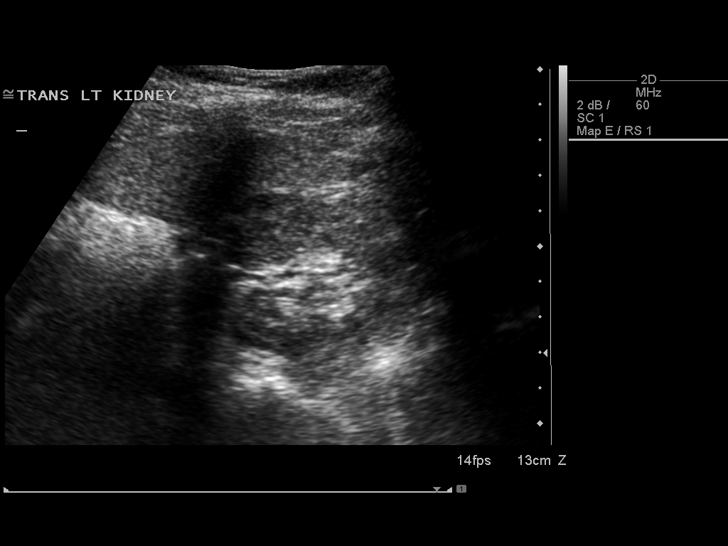

[14 of 25 positions shown; findings below may reference images not displayed]

FINDINGS: Gallbladder:  No gallstones, gallbladder wall thickening, or
pericholecystic fluid.

Common Bile Duct:  Measures 2.2 mm which is within normal limits in
caliber.

Liver: No focal mass lesion identified.  Within normal limits in
parenchymal echogenicity.

IVC:  Appears normal.

Pancreas:  No abnormality identified.

Spleen:  Within normal limits in size and echotexture.

Right kidney:  Measures 9.3 cm in length. Normal in size and
parenchymal echogenicity.  No evidence of mass or hydronephrosis.

Left kidney:  Measures 10.3 cm in length. Normal in size and
parenchymal echogenicity.  No evidence of mass or hydronephrosis.

Abdominal Aorta:  No aneurysm identified.
IMPRESSION: No abnormalities seen in the abdomen.

## 2014-11-28 ENCOUNTER — Ambulatory Visit (INDEPENDENT_AMBULATORY_CARE_PROVIDER_SITE_OTHER): Payer: BLUE CROSS/BLUE SHIELD | Admitting: Nurse Practitioner

## 2014-11-28 ENCOUNTER — Encounter: Payer: Self-pay | Admitting: Nurse Practitioner

## 2014-11-28 VITALS — BP 108/70 | HR 66 | Ht 62.75 in | Wt 110.4 lb

## 2014-11-28 DIAGNOSIS — R102 Pelvic and perineal pain: Secondary | ICD-10-CM

## 2014-11-28 DIAGNOSIS — Z01419 Encounter for gynecological examination (general) (routine) without abnormal findings: Secondary | ICD-10-CM | POA: Diagnosis not present

## 2014-11-28 DIAGNOSIS — R8290 Unspecified abnormal findings in urine: Secondary | ICD-10-CM

## 2014-11-28 DIAGNOSIS — D259 Leiomyoma of uterus, unspecified: Secondary | ICD-10-CM | POA: Diagnosis not present

## 2014-11-28 DIAGNOSIS — Z0000 Encounter for general adult medical examination without abnormal findings: Secondary | ICD-10-CM

## 2014-11-28 LAB — POCT URINALYSIS DIPSTICK
LEUKOCYTES UA: NEGATIVE
PH UA: 5
Urobilinogen, UA: NEGATIVE

## 2014-11-28 LAB — HEMOGLOBIN, FINGERSTICK: Hemoglobin, fingerstick: 11.7 g/dL — ABNORMAL LOW (ref 12.0–16.0)

## 2014-11-28 LAB — POCT URINE PREGNANCY: Preg Test, Ur: NEGATIVE

## 2014-11-28 NOTE — Patient Instructions (Signed)

## 2014-11-28 NOTE — Progress Notes (Signed)
49 y.o. [id number] Married   Caucasian Fe here for annual exam.  Menses last 5-6 days and heavy 3 days.  Heavy days 2 super pads changing every hour.  In general she feels pelvic discomfort that is not always related to menses.  She is having increased pain with SA.  She feels a lot of pelvic pressure and now a 'hardness' in the lower pelvic area.   Has a strong urine smell just in the mornings for the past month.  This does not sem to be related to foods or vitamin supplements.  Denies dysuria.    She has a history of fibroids and it has been recommended that she have a hysterectomy - she feels bad enough now to consider this option.   Married last year, but same partner for 10 years.  They had considered having a child and she had a tubal reversal about 9 years ago - using no method of birth control.  Patient's last menstrual period was 11/14/2014.          Sexually active: Yes.    The current method of family planning is none.(tubal reversal about 9 years ago)    Exercising: Yes.    walk, run, cycle Smoker:  no  Health Maintenance: Pap:  07/27/13 wnl MMG:  09/27/14 bi-rads 3: probably benign; right breast ultrasound- recheck in 6 months. Colonoscopy: 2014 polyps f/u every in 2017 TDaP:  07/27/13  Labs: Hgb: 11.7;   Urine: negative UPT: negative   reports that she has never smoked. She has never used smokeless tobacco. She reports that she does not drink alcohol or use illicit drugs.  Past Medical History  Diagnosis Date  . Migraines   . Hernia   . Anemia   . Arthritis   . Hypertension   . Neuromuscular disorder   . Asthma     hx  . Melanoma     X3  . PONV (postoperative nausea and vomiting)   . Melanoma 2000-2005    x3  . History of hemorrhoids     Past Surgical History  Procedure Laterality Date  . Hernia repair    . Tubal reversal    . Melanoma excision      X3  . Breast surgery      "uplift"  . Breast surgery  2002    lt lump-negative  . Breast biopsy Left 08/22/2012   Procedure: Needle localization removal left breast mass;  Surgeon: Haywood Lasso, MD;  Location: [facility];  Service: General;  Laterality: Left;  Needle localization removal left breast mass  . Tubal ligation  in 20's    And reverse tubal ligation    Current Outpatient Prescriptions  Medication Sig Dispense Refill  . Acetaminophen (TYLENOL PO) Take by mouth as needed.    . Naproxen Sodium (ALEVE PO) Take by mouth as needed.    Marland Kitchen Phenylephrine-APAP-Guaifenesin (SEVERE SINUS CONGESTION & PAIN PO) Take by mouth as needed.     No current facility-administered medications for this visit.    Family History  Problem Relation Age of Onset  . Colon cancer Mother 67  . Colon cancer Father 41  . Cancer Paternal Aunt 55    Breast    ROS:  Pertinent items are noted in HPI.  Otherwise, a comprehensive ROS was negative.  Exam:   BP 108/70 mmHg  Pulse 66  Ht 5' 2.75" (1.594 m)  Wt 110 lb 6.4 oz (50.077 kg)  BMI 19.71 kg/m2  LMP 11/14/2014  Height: 5' 2.75" (159.4 cm) Ht Readings from Last 3 Encounters:  11/28/14 5' 2.75" (1.594 m)  07/27/13 5\' 3"  (1.6 m)  02/16/13 5' 3.5" (1.613 m)    General appearance: alert, cooperative and appears stated age Head: Normocephalic, without obvious abnormality, atraumatic Neck: no adenopathy, supple, symmetrical, trachea midline and thyroid normal to inspection and palpation Lungs: clear to auscultation bilaterally Breasts: normal appearance, no masses or tenderness, S/P breast lift with saline implants and scars, FCB changes Heart: regular rate and rhythm Abdomen: soft, non-tender; no masses,  no organomegaly Extremities: extremities normal, atraumatic, no cyanosis or edema Skin: Skin color, texture, turgor normal. No rashes or lesions Lymph nodes: Cervical, supraclavicular, and axillary nodes normal. No abnormal inguinal nodes palpated Neurologic: Grossly normal   Pelvic: External genitalia:  no lesions              Urethra:   normal appearing urethra with no masses, tenderness or lesions              Bartholin's and Skene's: normal                 Vagina: normal appearing vagina with normal color and discharge, no lesions              Cervix: anteverted              Pap taken: Yes.   Bimanual Exam:  Uterus:  enlarged, 12 weeks size              Adnexa: positive for: enlargement, fullness and tenderness               Rectovaginal: Confirms               Anus:  normal sphincter tone, no lesions  Chaperone present: Yes  A:  Well Woman with normal exam  S/P BTL with reversal 9 years ago - no method of birth control  Strong family hx of colon cancer--mother and father deceased due to colon cancer  H/O melanoma--sees derm regularly  Enlarged uterus, fibroids on PUS. pelvic pain - Pt still interested in hysterectomy  H/O lung nodule on CXR 01/02/13. CT same day negative.  History of insomnia  R/O UTI    P:   Reviewed health and wellness pertinent to exam  Pap smear as above  Mammogram is due in October - diagnostic ultrasound  Will get a repeat PUS and follow  Follow with urine C&S to se if this is cause of urine odor  Asked pt to see PCP about insomnia issues and medication therapy  Counseled on breast self exam, mammography screening, adequate intake of calcium and vitamin D, diet and exercise, Kegel's exercises return annually or prn  An After Visit Summary was printed and given to the patient.

## 2014-11-29 ENCOUNTER — Telehealth: Payer: Self-pay | Admitting: Obstetrics & Gynecology

## 2014-11-29 LAB — URINE CULTURE: Colony Count: 30000

## 2014-11-29 LAB — URINALYSIS, MICROSCOPIC ONLY
Bacteria, UA: NONE SEEN
Casts: NONE SEEN
Crystals: NONE SEEN

## 2014-11-29 NOTE — Telephone Encounter (Signed)
Patient calling to check on the status of scheduling her ultrasound.

## 2014-11-29 NOTE — Telephone Encounter (Signed)
Spoke with patient. Patient was seen on 11/28/2014 with Kayla Cage, FNP. PUS recommended for further evaluation of pelvic discomfort. Appointment scheduled for 6/23 at 2pm with 2:30pm consult with Dr.Miller. Patient is agreeable to date and time. Order has been placed for precert.  Routing to provider for final review. Patient agreeable to disposition. Will close encounter.

## 2014-12-01 NOTE — Progress Notes (Signed)
Reviewed personally.  M. Suzanne Joycie Aerts, MD.  

## 2014-12-02 ENCOUNTER — Telehealth: Payer: Self-pay | Admitting: Obstetrics & Gynecology

## 2014-12-02 DIAGNOSIS — D259 Leiomyoma of uterus, unspecified: Secondary | ICD-10-CM

## 2014-12-02 LAB — IPS PAP TEST WITH HPV

## 2014-12-02 NOTE — Telephone Encounter (Signed)
Spoke with patient. Patient states that she will be on her cycle on 6/23 and would like to move PUS appointment to this week 6/16. Advised patient may be on cycle with PUS but patient declines due to heavy cycles and feeling uncomfortable. Advised no ultrasound appointments available for 6/16 at this time. Appointment rescheduled to 6/30 at 1pm with 1:30pm consult with Dr.Miller. Patient is agreeable to date and time.  Routing to provider for final review. Patient agreeable to disposition. Will close encounter.   Patient aware provider will review message and nurse will return call if any additional advice or change of disposition.

## 2014-12-02 NOTE — Telephone Encounter (Signed)
Patient has an appointment for ultrasound on Thursday the 23rd and she says her period will be heavy. She is wondering if it can be scheduled for this Thursday instead.

## 2014-12-12 ENCOUNTER — Other Ambulatory Visit: Payer: BLUE CROSS/BLUE SHIELD

## 2014-12-12 ENCOUNTER — Other Ambulatory Visit: Payer: BLUE CROSS/BLUE SHIELD | Admitting: Obstetrics & Gynecology

## 2014-12-18 NOTE — Addendum Note (Signed)
Addended by: Michele Mcalpine on: 12/18/2014 10:01 AM   Modules accepted: Orders

## 2014-12-19 ENCOUNTER — Ambulatory Visit (INDEPENDENT_AMBULATORY_CARE_PROVIDER_SITE_OTHER): Payer: BLUE CROSS/BLUE SHIELD | Admitting: Obstetrics & Gynecology

## 2014-12-19 ENCOUNTER — Other Ambulatory Visit: Payer: Self-pay | Admitting: Obstetrics & Gynecology

## 2014-12-19 ENCOUNTER — Ambulatory Visit (INDEPENDENT_AMBULATORY_CARE_PROVIDER_SITE_OTHER): Payer: BLUE CROSS/BLUE SHIELD

## 2014-12-19 VITALS — BP 116/76 | Resp 18 | Ht 62.75 in | Wt 108.0 lb

## 2014-12-19 DIAGNOSIS — N920 Excessive and frequent menstruation with regular cycle: Secondary | ICD-10-CM

## 2014-12-19 DIAGNOSIS — D259 Leiomyoma of uterus, unspecified: Secondary | ICD-10-CM

## 2014-12-19 DIAGNOSIS — D251 Intramural leiomyoma of uterus: Secondary | ICD-10-CM | POA: Diagnosis not present

## 2014-12-19 NOTE — Progress Notes (Signed)
49 y.o. Kayla Harris here for a pelvic ultrasound.  Pt has known hx of fibroid uterus and menorrhagia.  She and I have discussed symptoms off an on for the past several years and she is getting to the point where she thinks she wants to have something done about this.  Here for PUS to evaluate any change in size of uterus/fibroids.  Patient's last menstrual period was 11/14/2014.  Contraception: none.  H/O BTL with reversal but never successful pregnancy  Technique:  Both transabdominal and transvaginal ultrasound examinations of the pelvis were performed. Transabdominal technique was performed for global imaging of the pelvis including uterus, ovaries, adnexal regions, and pelvic cul-de-sac.  It was necessary to proceed with endovaginal exam following the abdominal ultrasound transabdominal exam to visualize the endometrium and adnexa.  Color and duplex Doppler ultrasound was utilized to evaluate blood flow to the ovaries.   FINDINGS: UTERUS:  9.9 x 6.7 x 6.0cm with 5cm fundal fibroid (prior measurement in 2014 was 12 x 6 x 5cm which included a 4.7cm fundal fibroid) and 2.1 x 1.2cm subserosal fibroid EMS: 9.51mm ADNEXA:   Left ovary 3.8 x 2.0 x 7.4JO with 8.7OM follicle   Right ovary 1.8 x 1.4 x 7.6HM with 70mm follicles CUL DE SAC:  No free fluid   SHSG:  After obtaining appropriate verbal consent from patient, the cervix was visualized using a speculum, and prepped with betadine.  A tenaculum  was not applied to the cervix.  Dilation of the cervix was not necessary. The catheter was passed into the uterus and sterile saline introduced, with the following findings: 72mm filling defect noted.  No endometrial biopsy was performed.    Pt and I discussed findings and her current symptoms.  We have discussed this multiple times over the past few years and I really think she would be benefit from some type of treatment for her fibroid and bleeding.  Hysterectomy has been scheduled in the past and  cancelled by the pt.  Reviewed treatment options for fibroid uterus including IUD use, endometrial ablation, myomectomy, [place], and hysterectomy.  At this time, pt feels she would like to proceed with hysterectomy.  ACOG bulletin on fibroids provided.  Procedure reviewed.  Pt will be called about scheduling.  Surgical options, risks/benefits, recovery, and hospital stay all discussed.  Assessment:  5cm fundal fibroid, menorrhagia  Plan: Pt would like to proceed with surgical scheduling at this time.  She will be called.  Plan pre-op endometrial biopsy as well.    ~25 minutes spent with patient >50% of time was in face to face discussion of above.

## 2014-12-26 ENCOUNTER — Telehealth: Payer: Self-pay | Admitting: Obstetrics & Gynecology

## 2014-12-26 NOTE — Telephone Encounter (Signed)
Called and left patient a message to call back.

## 2014-12-27 ENCOUNTER — Telehealth: Payer: Self-pay | Admitting: Obstetrics & Gynecology

## 2014-12-27 NOTE — Telephone Encounter (Signed)
Patient calling to check on status of pre-cert for a "possible hysterectomy."

## 2014-12-27 NOTE — Telephone Encounter (Signed)
Left patient a message to call back.

## 2014-12-31 ENCOUNTER — Encounter: Payer: Self-pay | Admitting: Obstetrics & Gynecology

## 2014-12-31 DIAGNOSIS — D259 Leiomyoma of uterus, unspecified: Secondary | ICD-10-CM | POA: Insufficient documentation

## 2014-12-31 DIAGNOSIS — N920 Excessive and frequent menstruation with regular cycle: Secondary | ICD-10-CM | POA: Insufficient documentation

## 2015-01-03 NOTE — Telephone Encounter (Signed)
Spoke with patient and reviewed benefits for surgery. Pt understood and requests time to think about it and call back to schedule. Surgery sheet back to [facility]. Benefits in guarantor account.

## 2015-01-16 NOTE — Telephone Encounter (Signed)
Call to patient to follow up on surgery scheduling. Patient declines to schedule at this time. She will call back when ready to proceed.   Any further follow-up need or ok to close encounter?

## 2015-01-21 NOTE — Telephone Encounter (Signed)
OK to close encounter. 

## 2015-01-22 ENCOUNTER — Telehealth: Payer: Self-pay | Admitting: Obstetrics & Gynecology

## 2015-01-22 NOTE — Telephone Encounter (Signed)
Uterus is biggish but mobile so I don't think it will be that difficult.  Ok to schedule if pt is ready to proceed.  Most likely will be able to do laparoscopically.

## 2015-01-22 NOTE — Telephone Encounter (Signed)
Patient is calling to speak with Sally. °

## 2015-01-22 NOTE — Telephone Encounter (Signed)
Return call to patient. Previous phone calls patient declined to proceed with surgery. Patient now states she must proceed with surgery because pain is unbearable. "Constant toothache in her abdomen."  Causing her to miss work, painful with all normal activity.  Son getting married 02-15-15. Discussed available dates prior to that date or just after.  Only realistic option prior to wedding would be Monday 01-27-15 and not sure that gives enough recovery time. Advised there is always the risk of possible abdominal hysterectomy which is six week recovery.  Due to her pain, patient interested in proceeding on 01-27-15 if Dr Sabra Heck thinks this is realistic.    Please advise.

## 2015-01-22 NOTE — Telephone Encounter (Signed)
Patient called stating she is ready to schedule her surgery.

## 2015-01-23 NOTE — Telephone Encounter (Signed)
Patient calling to check on surgery date possibilities. Advised attempts to schedule surgery for 01-27-15 unsuccessful. Any other date would be too close toson's wedding. Dr Ammie Ferrier recommends waiting until after. Patient understanding and agreeable to 02-17-15. All surgery appointments scheduled. Surgical instruction sheet reviewed and printed copy mailed to patient, see scanned copy. Patient will bring FMLA forms for completion to surgery consult. Case request sent to central scheduling.  Routing to provider for final review. Patient agreeable to disposition. Will close encounter.

## 2015-01-28 ENCOUNTER — Encounter: Payer: Self-pay | Admitting: Obstetrics & Gynecology

## 2015-01-28 ENCOUNTER — Other Ambulatory Visit: Payer: Self-pay | Admitting: Obstetrics & Gynecology

## 2015-01-28 ENCOUNTER — Ambulatory Visit (INDEPENDENT_AMBULATORY_CARE_PROVIDER_SITE_OTHER): Payer: BLUE CROSS/BLUE SHIELD | Admitting: Obstetrics & Gynecology

## 2015-01-28 VITALS — BP 110/64 | HR 64 | Resp 16 | Wt 111.0 lb

## 2015-01-28 DIAGNOSIS — R102 Pelvic and perineal pain unspecified side: Secondary | ICD-10-CM

## 2015-01-28 DIAGNOSIS — D252 Subserosal leiomyoma of uterus: Secondary | ICD-10-CM

## 2015-01-28 DIAGNOSIS — N920 Excessive and frequent menstruation with regular cycle: Secondary | ICD-10-CM | POA: Diagnosis not present

## 2015-01-28 MED ORDER — IBUPROFEN 800 MG PO TABS: 800.0000 mg | ORAL_TABLET | Freq: Three times a day (TID) | ORAL | Status: DC | PRN

## 2015-01-28 NOTE — Progress Notes (Signed)
49 y.o. [id number] MarriedCaucasian female here for discussion of upcoming procedure.  Total Laparoscopic Hysterectomy with bilateral salpingectomy, possible BSO, cystoscopy  planned due to uterine fibroid, menorrhagia, pelvic pain.  Pre-op evaluation thus far has included ultrasound 12/19/14 with 10 x 6.7 x 6.0cm uterus with 5cm fundal fibroid.  Ovaries were normal.   Patient has always been desirous of conservative management but due to change in pain has decided to proceed with definitive management.    Procedure discussed with patient.  Hospital stay, recovery and pain management all discussed.  Risks discussed including but not limited to bleeding, 1% risk of receiving a  transfusion, infection, 3-4% risk of bowel/bladder/ureteral/vascular injury discussed as well as possible need for additional surgery if injury does occur discussed.  DVT/PE and rare risk of death discussed.  My actual complications with prior surgeries discussed.  Vaginal cuff dehiscence discussed.  Hernia formation discussed.  Positioning and incision locations discussed.  Patient aware if pathology abnormal she may need additional treatment.  All questions answered.    Ob Hx:   Patient's last menstrual period was 01/27/2015.          Sexually active: Yes.   Birth control: no method Last pap: 11/28/14 WNL/negative HR HPV Last MMG: 09/24/14 3D MMG, 09/27/14 3D right diag/us-right diag/us in 6 months Tobacco: no  Past Surgical History  Procedure Laterality Date  . Hernia repair    . Tubal reversal    . Melanoma excision      X3  . Tubal ligation  in 20's    And reverse tubal ligation  . Breast surgery      "uplift"  with saline implants  . Breast surgery  2002    lt lump-negative  . Breast biopsy Left 08/22/2012    Procedure: Needle localization removal left breast mass;  Surgeon: Haywood Lasso, MD;  Location: [facility];  Service: General;  Laterality: Left;  Needle localization removal left breast mass     Past Medical History  Diagnosis Date  . Migraines   . Hernia   . Anemia   . Arthritis   . Hypertension     no medication  . Neuromuscular disorder   . Asthma     hx  . Melanoma     X3  . PONV (postoperative nausea and vomiting)   . Melanoma 2000-2005    x3  . History of hemorrhoids     Allergies: Dilaudid; Codeine; Flexeril; Imitrex; Other; and Penicillins  Current Outpatient Prescriptions  Medication Sig Dispense Refill  . diphenhydramine-acetaminophen (TYLENOL PM) 25-500 MG TABS Take 2 tablets by mouth at bedtime as needed (pain).    . Naproxen Sodium (ALEVE PO) Take 1 tablet by mouth as needed (pain).     . Phenylephrine-APAP-Guaifenesin (SEVERE SINUS CONGESTION & PAIN PO) Take 1 tablet by mouth as needed (migrains).      No current facility-administered medications for this visit.    ROS: A comprehensive review of systems was negative except for: increased pelvic pain  Exam:    BP 110/64 mmHg  Pulse 64  Resp 16  Wt 111 lb (50.349 kg)  LMP 01/27/2015  General appearance: alert and cooperative Head: Normocephalic, without obvious abnormality, atraumatic Neck: no adenopathy, supple, symmetrical, trachea midline and thyroid not enlarged, symmetric, no tenderness/mass/nodules Lungs: clear to auscultation bilaterally Heart: regular rate and rhythm, S1, S2 normal, no murmur, click, rub or gallop Abdomen: soft, non-tender except for when palpating where fibroid is located--that area is very tender;  bowel sounds normal; no masses,  no organomegaly Extremities: extremities normal, atraumatic, no cyanosis or edema Skin: Skin color, texture, turgor normal. No rashes or lesions Lymph nodes: Cervical, supraclavicular, and axillary nodes normal. no inguinal nodes palpated Neurologic: Grossly normal  Pelvic: External genitalia:  no lesions              Urethra: normal appearing urethra with no masses, tenderness or lesions              Bartholins and Skenes:  Bartholin's, Urethra, Skene's normal                 Vagina: normal appearing vagina with normal color and discharge, no lesions              Cervix: normal appearance              Pap taken: No.        Bimanual Exam:  Uterus:  enlarged to 10 week's size with tenderness just at fibroid                                      Adnexa:    normal adnexa in size, nontender and no masses                                      Rectovaginal: Deferred                             A: Pelvic Pain, Uterine Fibroids (possible degeneration of fibroid due to change in pain   P:  Total laparoscopic hysterectomy with bilateral salpingectomy, possible BSO, cystoscopy planned No pain medications given due to her sensitivities.  Will see if can tolerate dilaudid in hospital.   Medications/Vitamins reviewed. Hysterectomy brochure given for pre and post op instructions.  ~25 minutes spent with patient >50% of time was in face to face discussion of above.

## 2015-01-30 ENCOUNTER — Encounter: Payer: Self-pay | Admitting: Obstetrics & Gynecology

## 2015-01-31 ENCOUNTER — Telehealth: Payer: Self-pay | Admitting: *Deleted

## 2015-01-31 DIAGNOSIS — N920 Excessive and frequent menstruation with regular cycle: Secondary | ICD-10-CM

## 2015-01-31 DIAGNOSIS — D251 Intramural leiomyoma of uterus: Secondary | ICD-10-CM

## 2015-01-31 NOTE — Telephone Encounter (Signed)
Call to patient to schedule endometrial biopsy prior to surgery. Brief description of procedure. Instructed to take Motrin 800 mg one hour prior with food.  Appointment 02-03-15 at 230 pm.  Routing to provider for final review.  Will close encounter.

## 2015-02-03 ENCOUNTER — Ambulatory Visit (INDEPENDENT_AMBULATORY_CARE_PROVIDER_SITE_OTHER): Payer: BLUE CROSS/BLUE SHIELD | Admitting: Obstetrics & Gynecology

## 2015-02-03 ENCOUNTER — Encounter: Payer: Self-pay | Admitting: Obstetrics & Gynecology

## 2015-02-03 VITALS — BP 100/70 | HR 64 | Resp 16 | Wt 110.0 lb

## 2015-02-03 DIAGNOSIS — Z01812 Encounter for preprocedural laboratory examination: Secondary | ICD-10-CM

## 2015-02-03 DIAGNOSIS — N920 Excessive and frequent menstruation with regular cycle: Secondary | ICD-10-CM | POA: Diagnosis not present

## 2015-02-03 DIAGNOSIS — D251 Intramural leiomyoma of uterus: Secondary | ICD-10-CM

## 2015-02-03 LAB — POCT URINE PREGNANCY: PREG TEST UR: NEGATIVE

## 2015-02-03 MED ORDER — TRAMADOL HCL 50 MG PO TABS: 50.0000 mg | ORAL_TABLET | Freq: Four times a day (QID) | ORAL | Status: DC | PRN

## 2015-02-03 NOTE — Progress Notes (Signed)
Endometrial Biopsy Procedure Note  Indications: menorrhagia, pre-op  Pre-operative Diagnosis:  menorrhagia  Post-operative Diagnosis: same   Procedure Details   The risks (including infection, bleeding, pain, and uterine perforation) and benefits of the procedure were explained to the patient and written informed consent was obtained.    The patient was placed in the dorsal lithotomy position.  Bimanual exam was performed to assess uterine size and position.  A  speculum inserted in the vagina, and the cervix prepped with povidone iodine.  A sharp tenaculum  was applied to the cervix at 12 o'clock.  Dilation of the cervix was not  necessary.   A pipelle was used to sample the endometrium.  The uterus sounded to  8 cm.   Sample was sent for pathologic examination.  Condition: Stable  Complications: None  Plan:  The patient was advised to call for any fever or for prolonged or severe pain or bleeding. She was advised to use OTC pain relievers as needed for mild to moderate pain. She was advised to avoid vaginal intercourse for 24 hours or until the bleeding has completely stopped.  Results will be called to the patient.  Tramadol rx to pt to try 50mg  1-2 every 4-6 hrs as needed.  #60/0RF.  An after visit summary was provided to the patient.

## 2015-02-04 ENCOUNTER — Ambulatory Visit: Payer: BLUE CROSS/BLUE SHIELD | Admitting: Obstetrics & Gynecology

## 2015-02-06 ENCOUNTER — Encounter (HOSPITAL_COMMUNITY): Payer: Self-pay

## 2015-02-06 ENCOUNTER — Encounter (HOSPITAL_COMMUNITY)
Admission: RE | Admit: 2015-02-06 | Discharge: 2015-02-06 | Disposition: A | Discharge status: Home / Self Care | Payer: BLUE CROSS/BLUE SHIELD | Source: Ambulatory Visit | Attending: Obstetrics & Gynecology | Admitting: Obstetrics & Gynecology

## 2015-02-06 DIAGNOSIS — Z01818 Encounter for other preprocedural examination: Secondary | ICD-10-CM | POA: Insufficient documentation

## 2015-02-06 HISTORY — DX: Anxiety disorder, unspecified: F41.9

## 2015-02-06 LAB — CBC
HEMATOCRIT: 34.5 % — AB (ref 36.0–46.0)
Hemoglobin: 12 g/dL (ref 12.0–15.0)
MCH: 30.6 pg (ref 26.0–34.0)
MCHC: 34.8 g/dL (ref 30.0–36.0)
MCV: 88 fL (ref 78.0–100.0)
Platelets: 219 10*3/uL (ref 150–400)
RBC: 3.92 MIL/uL (ref 3.87–5.11)
RDW: 12.7 % (ref 11.5–15.5)
WBC: 6.3 10*3/uL (ref 4.0–10.5)

## 2015-02-06 NOTE — Patient Instructions (Signed)
Your procedure is scheduled on:  February 17, 2015    Enter through the Main Entrance of [facility] at:  6:00 am   Pick up the phone at the desk and dial [phone].  Call this number if you have problems the morning of surgery: [phone].  Remember: Do NOT eat food: after midnight on Sunday February 16, 2015   Do NOT drink clear liquids after:  Midnight on February 16, 2015    Take these medicines the morning of surgery with a SIP OF WATER:  None   Do NOT wear jewelry (body piercing), metal hair clips/bobby pins, make-up, or nail polish. Do NOT wear lotions, powders, or perfumes.  You may wear deoderant. Do NOT shave for 48 hours prior to surgery. Do NOT bring valuables to the hospital. Contacts, dentures, or bridgework may not be worn into surgery. Leave suitcase in car.  After surgery it may be brought to your room.  For patients admitted to the hospital, checkout time is 11:00 AM the day of discharge.

## 2015-02-07 ENCOUNTER — Telehealth: Payer: Self-pay | Admitting: Emergency Medicine

## 2015-02-07 NOTE — Telephone Encounter (Signed)
-----   Message from Megan Salon, MD sent at 02/06/2015  3:28 PM EDT ----- Please inform pt endometrial biopsy was negative for abnormal cells.  Having hysterectomy so this was done for pre-op testing.

## 2015-02-07 NOTE — Telephone Encounter (Signed)
Message left to return call to Kayla Harris at [phone].    

## 2015-02-07 NOTE — Telephone Encounter (Signed)
Patient returned call and message from Dr. Sabra Heck given. Patient verbalized understanding of results.  She will follow up as scheduled. Routing to provider for final review. Patient agreeable to disposition. Will close encounter.

## 2015-02-10 ENCOUNTER — Telehealth: Payer: Self-pay | Admitting: Obstetrics & Gynecology

## 2015-02-10 NOTE — Telephone Encounter (Signed)
Spoke with patient. Advised I spoke with Lamont Snowball, RN who states it is okay for her to take benadryl before her upcoming surgery on 02/17/2015. Patient is agreeable and verbalizes understanding.  Routing to provider for final review. Patient agreeable to disposition. Will close encounter.   Patient aware provider will review message and nurse will return call if any additional advice or change of disposition.

## 2015-02-10 NOTE — Telephone Encounter (Signed)
She is having surgery next week and has a question. Patient has some sinus issues and wants to know if she can take benadryl.

## 2015-02-12 NOTE — Anesthesia Preprocedure Evaluation (Addendum)
Anesthesia Evaluation  Patient identified by MRN, date of birth, ID band Patient awake    Reviewed: Allergy & Precautions, NPO status , Patient's Chart, lab work & pertinent test results  History of Anesthesia Complications (+) PONV  Airway Mallampati: II   Neck ROM: Full    Dental  (+) Caps, Dental Advisory Given,    Pulmonary asthma ,  breath sounds clear to auscultation  Pulmonary exam normal       Cardiovascular hypertension, Pt. on medications Rhythm:Regular  EKG 2014 WNL   Neuro/Psych  Headaches, Anxiety    GI/Hepatic negative GI ROS, Neg liver ROS,   Endo/Other    Renal/GU negative Renal ROS     Musculoskeletal   Abdominal Normal abdominal exam  (+)  Abdomen: soft.    Peds  Hematology 12/34   Anesthesia Other Findings   Reproductive/Obstetrics                          Anesthesia Physical Anesthesia Plan  ASA: II  Anesthesia Plan: General   Post-op Pain Management:    Induction: Intravenous  Airway Management Planned: Oral ETT  Additional Equipment:   Intra-op Plan:   Post-operative Plan:   Informed Consent: I have reviewed the patients History and Physical, chart, labs and discussed the procedure including the risks, benefits and alternatives for the proposed anesthesia with the patient or authorized representative who has indicated his/her understanding and acceptance.     Plan Discussed with:   Anesthesia Plan Comments:         Anesthesia Quick Evaluation

## 2015-02-16 MED ORDER — METRONIDAZOLE IN NACL 5-0.79 MG/ML-% IV SOLN
500.0000 mg | INTRAVENOUS | Status: AC
  Administered 2015-02-17: 500 mg via INTRAVENOUS
  Filled 2015-02-16: qty 100

## 2015-02-16 MED ORDER — CIPROFLOXACIN IN D5W 400 MG/200ML IV SOLN
400.0000 mg | INTRAVENOUS | Status: AC
  Administered 2015-02-17: 400 mg via INTRAVENOUS
  Filled 2015-02-16: qty 200

## 2015-02-17 ENCOUNTER — Ambulatory Visit (HOSPITAL_COMMUNITY): Payer: BLUE CROSS/BLUE SHIELD | Admitting: Anesthesiology

## 2015-02-17 ENCOUNTER — Ambulatory Visit (HOSPITAL_COMMUNITY)
Admission: RE | Admit: 2015-02-17 | Discharge: 2015-02-18 | Disposition: A | Discharge status: Home / Self Care | Payer: BLUE CROSS/BLUE SHIELD | Source: Ambulatory Visit | Attending: Obstetrics & Gynecology | Admitting: Obstetrics & Gynecology

## 2015-02-17 ENCOUNTER — Encounter (HOSPITAL_COMMUNITY): Payer: Self-pay | Admitting: *Deleted

## 2015-02-17 ENCOUNTER — Encounter (HOSPITAL_COMMUNITY)
Admission: RE | Disposition: A | Discharge status: Home / Self Care | Payer: Self-pay | Source: Ambulatory Visit | Attending: Obstetrics & Gynecology

## 2015-02-17 DIAGNOSIS — D259 Leiomyoma of uterus, unspecified: Secondary | ICD-10-CM | POA: Diagnosis not present

## 2015-02-17 DIAGNOSIS — R102 Pelvic and perineal pain: Secondary | ICD-10-CM | POA: Insufficient documentation

## 2015-02-17 DIAGNOSIS — M1990 Unspecified osteoarthritis, unspecified site: Secondary | ICD-10-CM | POA: Insufficient documentation

## 2015-02-17 DIAGNOSIS — Z885 Allergy status to narcotic agent status: Secondary | ICD-10-CM | POA: Insufficient documentation

## 2015-02-17 DIAGNOSIS — J45909 Unspecified asthma, uncomplicated: Secondary | ICD-10-CM | POA: Diagnosis not present

## 2015-02-17 DIAGNOSIS — Z888 Allergy status to other drugs, medicaments and biological substances status: Secondary | ICD-10-CM | POA: Diagnosis not present

## 2015-02-17 DIAGNOSIS — N939 Abnormal uterine and vaginal bleeding, unspecified: Secondary | ICD-10-CM | POA: Diagnosis present

## 2015-02-17 DIAGNOSIS — Z880 Allergy status to penicillin: Secondary | ICD-10-CM | POA: Insufficient documentation

## 2015-02-17 DIAGNOSIS — N802 Endometriosis of fallopian tube: Secondary | ICD-10-CM | POA: Diagnosis not present

## 2015-02-17 DIAGNOSIS — Z85820 Personal history of malignant melanoma of skin: Secondary | ICD-10-CM | POA: Insufficient documentation

## 2015-02-17 DIAGNOSIS — N852 Hypertrophy of uterus: Secondary | ICD-10-CM | POA: Diagnosis not present

## 2015-02-17 DIAGNOSIS — Z800 Family history of malignant neoplasm of digestive organs: Secondary | ICD-10-CM | POA: Diagnosis not present

## 2015-02-17 DIAGNOSIS — D251 Intramural leiomyoma of uterus: Secondary | ICD-10-CM | POA: Diagnosis not present

## 2015-02-17 DIAGNOSIS — N72 Inflammatory disease of cervix uteri: Secondary | ICD-10-CM | POA: Insufficient documentation

## 2015-02-17 DIAGNOSIS — N809 Endometriosis, unspecified: Secondary | ICD-10-CM | POA: Diagnosis present

## 2015-02-17 DIAGNOSIS — Z803 Family history of malignant neoplasm of breast: Secondary | ICD-10-CM | POA: Diagnosis not present

## 2015-02-17 DIAGNOSIS — G43909 Migraine, unspecified, not intractable, without status migrainosus: Secondary | ICD-10-CM | POA: Insufficient documentation

## 2015-02-17 DIAGNOSIS — I10 Essential (primary) hypertension: Secondary | ICD-10-CM | POA: Insufficient documentation

## 2015-02-17 DIAGNOSIS — F419 Anxiety disorder, unspecified: Secondary | ICD-10-CM | POA: Diagnosis not present

## 2015-02-17 DIAGNOSIS — N920 Excessive and frequent menstruation with regular cycle: Secondary | ICD-10-CM | POA: Insufficient documentation

## 2015-02-17 HISTORY — PX: CYSTOSCOPY: SHX5120

## 2015-02-17 HISTORY — PX: LAPAROSCOPIC HYSTERECTOMY: SHX1926

## 2015-02-17 HISTORY — PX: LAPAROSCOPIC BILATERAL SALPINGECTOMY: SHX5889

## 2015-02-17 LAB — PREGNANCY, URINE: PREG TEST UR: NEGATIVE

## 2015-02-17 SURGERY — HYSTERECTOMY, TOTAL, LAPAROSCOPIC
Anesthesia: General | Site: Abdomen

## 2015-02-17 MED ORDER — PROMETHAZINE HCL 25 MG RE SUPP: 12.5000 mg | Freq: Four times a day (QID) | RECTAL | Status: DC | PRN

## 2015-02-17 MED ORDER — GLYCOPYRROLATE 0.2 MG/ML IJ SOLN
INTRAMUSCULAR | Status: DC | PRN
  Administered 2015-02-17: 0.6 mg via INTRAVENOUS

## 2015-02-17 MED ORDER — LIDOCAINE HCL (CARDIAC) 20 MG/ML IV SOLN
INTRAVENOUS | Status: DC | PRN
  Administered 2015-02-17: 100 mg via INTRAVENOUS

## 2015-02-17 MED ORDER — ROCURONIUM BROMIDE 100 MG/10ML IV SOLN
INTRAVENOUS | Status: DC | PRN
  Administered 2015-02-17 (×3): 10 mg via INTRAVENOUS
  Administered 2015-02-17: 35 mg via INTRAVENOUS
  Administered 2015-02-17: 10 mg via INTRAVENOUS
  Administered 2015-02-17: 5 mg via INTRAVENOUS

## 2015-02-17 MED ORDER — ALUM & MAG HYDROXIDE-SIMETH 200-200-20 MG/5ML PO SUSP: 30.0000 mL | ORAL | Status: DC | PRN

## 2015-02-17 MED ORDER — SIMETHICONE 80 MG PO CHEW: 80.0000 mg | CHEWABLE_TABLET | Freq: Four times a day (QID) | ORAL | Status: DC | PRN

## 2015-02-17 MED ORDER — FENTANYL CITRATE (PF) 100 MCG/2ML IJ SOLN
INTRAMUSCULAR | Status: DC | PRN
  Administered 2015-02-17 (×6): 50 ug via INTRAVENOUS

## 2015-02-17 MED ORDER — MIDAZOLAM HCL 2 MG/2ML IJ SOLN
INTRAMUSCULAR | Status: DC | PRN
  Administered 2015-02-17 (×2): 1 mg via INTRAVENOUS

## 2015-02-17 MED ORDER — LACTATED RINGERS IV SOLN
INTRAVENOUS | Status: DC
  Administered 2015-02-17 (×3): via INTRAVENOUS

## 2015-02-17 MED ORDER — ROPIVACAINE HCL 5 MG/ML IJ SOLN
INTRAMUSCULAR | Status: DC | PRN
  Administered 2015-02-17: 30 mL

## 2015-02-17 MED ORDER — SCOPOLAMINE 1 MG/3DAYS TD PT72
MEDICATED_PATCH | TRANSDERMAL | Status: AC
  Administered 2015-02-17: 1.5 mg via TRANSDERMAL
  Filled 2015-02-17: qty 1

## 2015-02-17 MED ORDER — LIDOCAINE HCL (CARDIAC) 20 MG/ML IV SOLN
INTRAVENOUS | Status: AC
  Filled 2015-02-17: qty 5

## 2015-02-17 MED ORDER — PNEUMOCOCCAL VAC POLYVALENT 25 MCG/0.5ML IJ INJ
0.5000 mL | INJECTION | INTRAMUSCULAR | Status: DC
  Filled 2015-02-17: qty 0.5

## 2015-02-17 MED ORDER — PROPOFOL 10 MG/ML IV BOLUS
INTRAVENOUS | Status: AC
  Filled 2015-02-17: qty 20

## 2015-02-17 MED ORDER — DEXAMETHASONE SODIUM PHOSPHATE 10 MG/ML IJ SOLN
INTRAMUSCULAR | Status: DC | PRN
  Administered 2015-02-17: 8 mg via INTRAVENOUS

## 2015-02-17 MED ORDER — KETOROLAC TROMETHAMINE 30 MG/ML IJ SOLN
INTRAMUSCULAR | Status: AC
  Filled 2015-02-17: qty 1

## 2015-02-17 MED ORDER — MIDAZOLAM HCL 2 MG/2ML IJ SOLN
INTRAMUSCULAR | Status: AC
  Filled 2015-02-17: qty 4

## 2015-02-17 MED ORDER — SODIUM CHLORIDE 0.9 % IJ SOLN
INTRAMUSCULAR | Status: AC
  Filled 2015-02-17: qty 10

## 2015-02-17 MED ORDER — FENTANYL CITRATE (PF) 100 MCG/2ML IJ SOLN
INTRAMUSCULAR | Status: AC
  Filled 2015-02-17: qty 4

## 2015-02-17 MED ORDER — PANTOPRAZOLE SODIUM 40 MG IV SOLR
40.0000 mg | Freq: Every day | INTRAVENOUS | Status: DC
  Administered 2015-02-17: 40 mg via INTRAVENOUS
  Filled 2015-02-17 (×2): qty 40

## 2015-02-17 MED ORDER — NEOSTIGMINE METHYLSULFATE 10 MG/10ML IV SOLN
INTRAVENOUS | Status: DC | PRN
  Administered 2015-02-17: 3 mg via INTRAVENOUS

## 2015-02-17 MED ORDER — DEXTROSE-NACL 5-0.45 % IV SOLN
INTRAVENOUS | Status: DC
  Administered 2015-02-17 (×2): via INTRAVENOUS

## 2015-02-17 MED ORDER — GLYCOPYRROLATE 0.2 MG/ML IJ SOLN
INTRAMUSCULAR | Status: AC
  Filled 2015-02-17: qty 3

## 2015-02-17 MED ORDER — PROMETHAZINE HCL 12.5 MG PO TABS
6.2500 mg | ORAL_TABLET | Freq: Four times a day (QID) | ORAL | Status: DC | PRN
Start: 2015-02-17 — End: 2015-02-18
  Filled 2015-02-17: qty 1

## 2015-02-17 MED ORDER — ONDANSETRON HCL 4 MG/2ML IJ SOLN
INTRAMUSCULAR | Status: AC
  Filled 2015-02-17: qty 2

## 2015-02-17 MED ORDER — ROCURONIUM BROMIDE 100 MG/10ML IV SOLN
INTRAVENOUS | Status: AC
  Filled 2015-02-17: qty 1

## 2015-02-17 MED ORDER — ROPIVACAINE HCL 5 MG/ML IJ SOLN
INTRAMUSCULAR | Status: AC
  Filled 2015-02-17: qty 30

## 2015-02-17 MED ORDER — PROPOFOL 10 MG/ML IV BOLUS
INTRAVENOUS | Status: DC | PRN
  Administered 2015-02-17: 150 mg via INTRAVENOUS

## 2015-02-17 MED ORDER — BUPIVACAINE HCL (PF) 0.25 % IJ SOLN
INTRAMUSCULAR | Status: AC
  Filled 2015-02-17: qty 30

## 2015-02-17 MED ORDER — SODIUM CHLORIDE 0.9 % IJ SOLN
INTRAMUSCULAR | Status: AC
Start: 2015-02-17 — End: 2015-02-17
  Filled 2015-02-17: qty 50

## 2015-02-17 MED ORDER — MEPERIDINE HCL 25 MG/ML IJ SOLN: 6.2500 mg | INTRAMUSCULAR | Status: DC | PRN

## 2015-02-17 MED ORDER — ACETAMINOPHEN 325 MG PO TABS: 650.0000 mg | ORAL_TABLET | ORAL | Status: DC | PRN

## 2015-02-17 MED ORDER — FENTANYL CITRATE (PF) 100 MCG/2ML IJ SOLN
25.0000 ug | INTRAMUSCULAR | Status: DC | PRN
  Administered 2015-02-17 (×3): 50 ug via INTRAVENOUS

## 2015-02-17 MED ORDER — ONDANSETRON HCL 4 MG/2ML IJ SOLN
4.0000 mg | Freq: Four times a day (QID) | INTRAMUSCULAR | Status: DC | PRN
  Administered 2015-02-17: 4 mg via INTRAVENOUS
  Filled 2015-02-17: qty 2

## 2015-02-17 MED ORDER — PROMETHAZINE HCL 25 MG/ML IJ SOLN
6.2500 mg | Freq: Four times a day (QID) | INTRAMUSCULAR | Status: DC | PRN
  Administered 2015-02-17: 6.25 mg via INTRAVENOUS
  Filled 2015-02-17: qty 1

## 2015-02-17 MED ORDER — FENTANYL CITRATE (PF) 100 MCG/2ML IJ SOLN
INTRAMUSCULAR | Status: AC
  Filled 2015-02-17: qty 2

## 2015-02-17 MED ORDER — MENTHOL 3 MG MT LOZG
1.0000 | LOZENGE | OROMUCOSAL | Status: DC | PRN
  Administered 2015-02-17: 3 mg via ORAL
  Filled 2015-02-17: qty 9

## 2015-02-17 MED ORDER — DEXAMETHASONE SODIUM PHOSPHATE 10 MG/ML IJ SOLN
INTRAMUSCULAR | Status: AC
  Filled 2015-02-17: qty 1

## 2015-02-17 MED ORDER — KETOROLAC TROMETHAMINE 30 MG/ML IJ SOLN: 15.0000 mg | Freq: Four times a day (QID) | INTRAMUSCULAR | Status: DC

## 2015-02-17 MED ORDER — FENTANYL CITRATE (PF) 250 MCG/5ML IJ SOLN
INTRAMUSCULAR | Status: AC
  Filled 2015-02-17: qty 25

## 2015-02-17 MED ORDER — MORPHINE SULFATE (PF) 4 MG/ML IV SOLN
2.0000 mg | INTRAVENOUS | Status: DC | PRN
  Administered 2015-02-17: 2 mg via INTRAVENOUS
  Filled 2015-02-17: qty 1

## 2015-02-17 MED ORDER — SODIUM CHLORIDE 0.9 % IJ SOLN
INTRAMUSCULAR | Status: DC | PRN
  Administered 2015-02-17: 30 mL

## 2015-02-17 MED ORDER — OXYCODONE-ACETAMINOPHEN 5-325 MG PO TABS: 1.0000 | ORAL_TABLET | ORAL | Status: DC | PRN

## 2015-02-17 MED ORDER — LIDOCAINE-EPINEPHRINE 1 %-1:100000 IJ SOLN
INTRAMUSCULAR | Status: AC
  Filled 2015-02-17: qty 1

## 2015-02-17 MED ORDER — ACETAMINOPHEN 10 MG/ML IV SOLN
1000.0000 mg | Freq: Once | INTRAVENOUS | Status: AC
Start: 2015-02-17 — End: 2015-02-17
  Administered 2015-02-17: 1000 mg via INTRAVENOUS
  Filled 2015-02-17: qty 100

## 2015-02-17 MED ORDER — STERILE WATER FOR IRRIGATION IR SOLN
Status: DC | PRN
  Administered 2015-02-17: 1000 mL via INTRAVESICAL

## 2015-02-17 MED ORDER — FENTANYL CITRATE (PF) 100 MCG/2ML IJ SOLN
INTRAMUSCULAR | Status: AC
  Administered 2015-02-17: 50 ug via INTRAVENOUS
  Filled 2015-02-17: qty 2

## 2015-02-17 MED ORDER — SCOPOLAMINE 1 MG/3DAYS TD PT72
1.0000 | MEDICATED_PATCH | Freq: Once | TRANSDERMAL | Status: DC
  Administered 2015-02-17: 1.5 mg via TRANSDERMAL

## 2015-02-17 MED ORDER — BUPIVACAINE HCL (PF) 0.25 % IJ SOLN
INTRAMUSCULAR | Status: DC | PRN
  Administered 2015-02-17: 10 mL

## 2015-02-17 MED ORDER — ONDANSETRON HCL 4 MG/2ML IJ SOLN
INTRAMUSCULAR | Status: DC | PRN
  Administered 2015-02-17: 4 mg via INTRAVENOUS

## 2015-02-17 MED ORDER — KETOROLAC TROMETHAMINE 30 MG/ML IJ SOLN
15.0000 mg | Freq: Four times a day (QID) | INTRAMUSCULAR | Status: DC
  Administered 2015-02-17 – 2015-02-18 (×5): 15 mg via INTRAVENOUS
  Filled 2015-02-17 (×5): qty 1

## 2015-02-17 MED ORDER — KETOROLAC TROMETHAMINE 30 MG/ML IJ SOLN
INTRAMUSCULAR | Status: DC | PRN
  Administered 2015-02-17: 15 mg via INTRAVENOUS

## 2015-02-17 MED ORDER — NEOSTIGMINE METHYLSULFATE 10 MG/10ML IV SOLN
INTRAVENOUS | Status: AC
  Filled 2015-02-17: qty 1

## 2015-02-17 SURGICAL SUPPLY — 65 items
APL SKNCLS STERI-STRIP NONHPOA (GAUZE/BANDAGES/DRESSINGS)
BAG SPEC RTRVL LRG 6X4 10 (ENDOMECHANICALS)
BENZOIN TINCTURE PRP APPL 2/3 (GAUZE/BANDAGES/DRESSINGS) IMPLANT
BLADE SURG 15 STRL LF C SS BP (BLADE) IMPLANT
BLADE SURG 15 STRL SS (BLADE)
CABLE HIGH FREQUENCY MONO STRZ (ELECTRODE) ×4 IMPLANT
CATH ROBINSON RED A/P 16FR (CATHETERS) IMPLANT
CHLORAPREP W/TINT 26ML (MISCELLANEOUS) ×4 IMPLANT
CLOTH BEACON ORANGE TIMEOUT ST (SAFETY) ×4 IMPLANT
COVER BACK TABLE 60X90IN (DRAPES) ×4 IMPLANT
COVER LIGHT HANDLE  1/PK (MISCELLANEOUS) ×1
COVER LIGHT HANDLE 1/PK (MISCELLANEOUS) ×3 IMPLANT
DISSECTOR BLUNT TIP ENDO 5MM (MISCELLANEOUS) IMPLANT
DRSG COVADERM PLUS 2X2 (GAUZE/BANDAGES/DRESSINGS) ×8 IMPLANT
DRSG OPSITE POSTOP 3X4 (GAUZE/BANDAGES/DRESSINGS) ×1 IMPLANT
DURAPREP 26ML APPLICATOR (WOUND CARE) ×4 IMPLANT
EVACUATOR SMOKE 8.L (FILTER) ×8 IMPLANT
FORCEPS CUTTING 33CM 5MM (CUTTING FORCEPS) IMPLANT
GLOVE BIOGEL PI IND STRL 7.0 (GLOVE) ×12 IMPLANT
GLOVE BIOGEL PI INDICATOR 7.0 (GLOVE) ×4
GLOVE ECLIPSE 6.5 STRL STRAW (GLOVE) ×8 IMPLANT
GOWN STRL REUS W/TWL LRG LVL3 (GOWN DISPOSABLE) ×4 IMPLANT
HEMOSTAT SURGICEL 2X14 (HEMOSTASIS) ×1 IMPLANT
LIQUID BAND (GAUZE/BANDAGES/DRESSINGS) ×1 IMPLANT
NEEDLE INSUFFLATION 120MM (ENDOMECHANICALS) ×4 IMPLANT
NS IRRIG 1000ML POUR BTL (IV SOLUTION) ×4 IMPLANT
OCCLUDER COLPOPNEUMO (BALLOONS) ×4 IMPLANT
PACK LAPAROSCOPY BASIN (CUSTOM PROCEDURE TRAY) ×4 IMPLANT
PACK ROBOTIC GOWN (GOWN DISPOSABLE) ×4 IMPLANT
PAD POSITIONING PINK XL (MISCELLANEOUS) ×4 IMPLANT
POUCH SPECIMEN RETRIEVAL 10MM (ENDOMECHANICALS) IMPLANT
PROTECTOR NERVE ULNAR (MISCELLANEOUS) ×4 IMPLANT
SCISSORS LAP 5X35 DISP (ENDOMECHANICALS) IMPLANT
SEALER TISSUE G2 CVD JAW 35 (ENDOMECHANICALS) IMPLANT
SEALER TISSUE G2 CVD JAW 45CM (ENDOMECHANICALS)
SET CYSTO W/LG BORE CLAMP LF (SET/KITS/TRAYS/PACK) ×1 IMPLANT
SET IRRIG TUBING LAPAROSCOPIC (IRRIGATION / IRRIGATOR) ×4 IMPLANT
SHEARS HARMONIC ACE PLUS 36CM (ENDOMECHANICALS) ×1 IMPLANT
SLEEVE XCEL OPT CAN 5 100 (ENDOMECHANICALS) ×1 IMPLANT
SOLUTION ELECTROLUBE (MISCELLANEOUS) ×4 IMPLANT
SPATULA 33CM PLASMA (CUTTING FORCEPS) IMPLANT
STRIP CLOSURE SKIN 1/2X4 (GAUZE/BANDAGES/DRESSINGS) IMPLANT
STRIP CLOSURE SKIN 1/4X4 (GAUZE/BANDAGES/DRESSINGS) ×4 IMPLANT
SUT VIC AB 0 CT1 36 (SUTURE) ×1 IMPLANT
SUT VIC AB 3-0 PS2 18 (SUTURE) ×4
SUT VIC AB 3-0 PS2 18XBRD (SUTURE) IMPLANT
SUT VICRYL 0 UR6 27IN ABS (SUTURE) ×1 IMPLANT
SUT VICRYL 4-0 PS2 18IN ABS (SUTURE) ×4 IMPLANT
SUT VLOC 180 0 9IN  GS21 (SUTURE) ×1
SUT VLOC 180 0 9IN GS21 (SUTURE) IMPLANT
SYR 30ML LL (SYRINGE) IMPLANT
SYR 50ML LL SCALE MARK (SYRINGE) ×4 IMPLANT
SYRINGE 60CC LL (MISCELLANEOUS) ×1 IMPLANT
TIP UTERINE 5.1X6CM LAV DISP (MISCELLANEOUS) IMPLANT
TIP UTERINE 6.7X10CM GRN DISP (MISCELLANEOUS) IMPLANT
TIP UTERINE 6.7X6CM WHT DISP (MISCELLANEOUS) IMPLANT
TIP UTERINE 6.7X8CM BLUE DISP (MISCELLANEOUS) ×1 IMPLANT
TOWEL OR 17X24 6PK STRL BLUE (TOWEL DISPOSABLE) ×8 IMPLANT
TRAY FOLEY CATH SILVER 14FR (SET/KITS/TRAYS/PACK) ×4 IMPLANT
TROCAR BALLN 12MMX100 BLUNT (TROCAR) IMPLANT
TROCAR OPTI TIP 5M 100M (ENDOMECHANICALS) ×1 IMPLANT
TROCAR XCEL NON-BLD 11X100MML (ENDOMECHANICALS) ×2 IMPLANT
TROCAR XCEL NON-BLD 5MMX100MML (ENDOMECHANICALS) ×1 IMPLANT
WARMER LAPAROSCOPE (MISCELLANEOUS) ×4 IMPLANT
WATER STERILE IRR 1000ML POUR (IV SOLUTION) ×4 IMPLANT

## 2015-02-17 NOTE — Addendum Note (Signed)
Addendum  created 02/17/15 1634 by Hewitt Blade, CRNA   Modules edited: Notes Section   Notes Section:  File: [id number]

## 2015-02-17 NOTE — Transfer of Care (Signed)
Immediate Anesthesia Transfer of Care Note  Patient: Kayla Harris  Procedure(s) Performed: Procedure(s): HYSTERECTOMY TOTAL LAPAROSCOPIC (N/A) LAPAROSCOPIC BILATERAL SALPINGECTOMY (Bilateral) CYSTOSCOPY (N/A)  Patient Location: PACU  Anesthesia Type:General  Level of Consciousness: awake, alert , oriented and patient cooperative  Airway & Oxygen Therapy: Patient Spontanous Breathing and Patient connected to nasal cannula oxygen  Post-op Assessment: Report given to RN and Post -op Vital signs reviewed and stable  Post vital signs: Reviewed and stable  Last Vitals:  Filed Vitals:   02/17/15 0615  BP: 123/75  Pulse: 72  Temp: 36.5 C  Resp: 16    Complications: No apparent anesthesia complications

## 2015-02-17 NOTE — Anesthesia Postprocedure Evaluation (Signed)
  Anesthesia Post-op Note  Patient: Kayla Harris  Procedure(s) Performed: Procedure(s): HYSTERECTOMY TOTAL LAPAROSCOPIC (N/A) LAPAROSCOPIC BILATERAL SALPINGECTOMY (Bilateral) CYSTOSCOPY (N/A)  Patient Location: [facility]  Anesthesia Type:General  Level of Consciousness: awake, alert  and oriented  Airway and Oxygen Therapy: Patient Spontanous Breathing  Post-op Pain: none  Post-op Assessment: Post-op Vital signs reviewed and Patient's Cardiovascular Status Stable              Post-op Vital Signs: Reviewed and stable  Last Vitals:  Filed Vitals:   02/17/15 1415  BP: 121/68  Pulse: 63  Temp: 36.7 C  Resp: 20    Complications: No apparent anesthesia complications

## 2015-02-17 NOTE — Op Note (Addendum)
02/17/2015  11:05 AM  PATIENT:  Kayla Harris  49 y.o. female  PRE-OPERATIVE DIAGNOSIS:  fibroids, menorrhagia  POST-OPERATIVE DIAGNOSIS:  fibroids, menorrhagia, enodmetriosis  PROCEDURE:  Procedure(s): HYSTERECTOMY TOTAL LAPAROSCOPIC LAPAROSCOPIC BILATERAL SALPINGECTOMY CYSTOSCOPY  SURGEON:  Denis Carreon SUZANNE  ASSISTANTS: Sumner Boast, MD   ANESTHESIA:   general  ESTIMATED BLOOD LOSS: 75cc  BLOOD ADMINISTERED:none   FLUIDS: 1900ccLR  UOP: 125cc clear UOP  SPECIMEN:  Uterus and cervix, bilateral fallopian tubes  DISPOSITION OF SPECIMEN:  PATHOLOGY  FINDINGS: enlarged uterus, h/o prior BTL with tubal reanastomosis could be seen on tubes where this was performed.  Small area of endometriosis on left uterosacral ligament.  DESCRIPTION OF OPERATION:  Patient is taken to the operating room. She is placed in the supine position. She is a running IV in place. Informed consent was present on the chart. SCDs on her lower extremities and functioning properly. General endotracheal anesthesia was administered by the anesthesia staff without difficulty. Dr. Lyndle Herrlich oversaw case. Once adequate anesthesia was confirmed the legs are placed in the low lithotomy position in [place]. Her arms were tucked by the side.   Dura prep was then used to prep the abdomen and Betadine was used to prep the inner thighs, perineum and vagina. Once 3 minutes had past the patient was draped in a normal standard fashion. The legs were lifted to the high lithotomy position. The cervix was visualized by placing a heavy weighted speculum in the posterior aspect of the vagina and using a curved Deaver retractor to the retract anteriorly. The anterior lip of the cervix was grasped with single-tooth tenaculum.  The cervix sounded to 8 cm. Pratt dilators were used to dilate the cervix up to a #21. A RUMI uterine manipulator was obtained. A #8 disposable tip was placed on the RUMI manipulator as well as a  large, silver KOH ring. This was passed through the cervix and the bulb of the disposable tip was inflated with 10 cc of normal saline. There was a good fit of the KOH ring around the cervix. The tenaculum was removed. There is also good manipulation of the uterus. The speculum and retractor were removed as well. A Foley catheter was placed to straight drain. The concentrated urine was noted. Legs were lowered to the low lithotomy position and attention was turned the abdomen.  The umbilicus was everted.  A Veress needle was obtained. Syringe of sterile saline was placed on a open Veress needle.  This was passed into the umbilicus until just when the fluid started to drip.  Then low flow CO2 gas was attached the needle and the pneumoperitoneum was achieved without difficulty. Once four liters of gas was in the abdomen the Veress needle was removed and a 5 millimeter non-bladed Optiview trocar and port were passed directly to the abdomen. The laparoscope was then used to confirm intraperitoneal placement. A large uterus with fundal fibroid was noted.  There was some endometriosis appearing tissue on the back of the cervix.  Ovaries were normal.  Are where prior tubal reanastomosis had been performed was seen and looks really nice, without any adhesions.  Locations for RLQ and LLQ ports were noted by transillumination of the abdominal wall.  0.25% marcaine was used to anesthetize the skin.  21mm skin incisions were made and then 55mm bladed ports were placed.  Finally a midline incision was made about 4 cm above the pubic symphysis.  The skin was incised about 1cm and a non-bladed  12 port was placed with direct visualization of the laparoscope.     47mm skin incisions were made about 10 cm lateral to the umbilicus on each side. Then 26mm nondisposable trocar ports were passed directly into the abdomen. Also on the right lower quadrant a 5 mm skin incision was made after anesthetize the skin with the ropivacaine  mixture. A 5 mm non-bladed trocar port were also passed directly into the abdomen. All trochars were removed.  The table was placed on the floor and the patient was placed in steep Trendelenburg.  The robot was docked in a normal standard fashion to the left of the table. In the #1 arm was placed endoscopic scissors with monopolar cautery attached and then the #2 arm was placed PK [place] with bipolar cautery attached. The systems are right side of the table for the right lower quadrant incision was located.  Ureters were identifies.  Attention was turned to the left side. With uterus on stretch the left tube was excised off the ovary and mesosalpinx was dissected to free the tube. Then the left utero-ovarian pedicle was serially clamped cauterized and incised using the ligasure device. Left round ligament was serially clamped cauterized and incised. The anterior and posterior peritoneum of the inferior leaf of the broad ligament were opened. The beginning of the baldde flap was created.  The bladder was taken down below the level of the KOH ring. The left uterine artery skeletonized and then just superior to the KOH ring this vessel was serially clamped, cauterized, and incised.  Attention was turned the right side.  The uterus was placed on stretch to the opposite side.  The tube was excised off the ovary using sharp dissection a bipolar cautery.  The mesosalpinx was incised freeing the tube. Then the right uterine ovarian pedicle was serially clamped cauterized and incised. Next the right round ligament was serially clamped cauterized and incised. The anterior posterior peritoneum of the inferiorly for the broad ligament were opened. The anterior peritoneum was carried across to the dissection on the left side. The remainder of the bladder flap was created using sharp dissection. The bladder was well below the level of the KOH ring. The left uterine artery skeletonized. Then the left uterine artery, above  the level of the KOH ring, was serially clamped cauterized and incised. The uterus was devascularized at this point.  The colpotomy was performed a starting in the midline and using a harmonic scalpel with the inferior edge of the open blade  This was carried around a circumferential fashion until the vaginal mucosa was completely incised in the specimen was freed.  The specimen was then delivered to the vagina.  A vaginal occlusive device was used to maintain the pneumoperitoneum  Instruments were changed with a needle driver and million dollar graspers.  Using a 9 inch V. lock suture, the cuff was closed by incorporating the anterior and posterior vaginal mucosa in each stitch. This was carried across all the way to the left corner and a running fashion. Two stitches were brought back towards the midline and the suture was cut flush with the vagina. The needle was brought out the pelvis. The pelvis was irrigated. All pedicles were inspected. No bleeding was noted. In Interceed was placed across vaginal cuff. Ureters were noted deep in the pelvis to be peristalsing.  At this point the procedure was completed. The largest port was closed with a fascial closure device after the port was removed.  Suture was tied  and excellent closure of the fascia was noted.  The remaining instruments were removed.  The ports were removed under direct visualization of the laparoscope and the pneumoperitoneum was relieved.  The patient was taken out of Trendelenburg positioning.  Several deep breaths were given to the patient's trying to any gas the abdomen and finally the midline port was removed.  The skin was then closed with subcuticular stitches of 3-0 Vicryl. The skin was cleansed Dermabond was applied. Attention was then turned the vagina and the cuff was inspected. No bleeding was noted. The anterior posterior vaginal mucosa was incorporated in each stitch. The Foley catheter was removed.  Cystoscopy was performed.  No  sutures or bladder injuries were noted.  Ureters were noted with normal urine jets from each one was seen.  Foley was replaced and the cystoscopic fluid was drained.  Sponge, lap, needle, initially counts were correct x2. Patient tolerated the procedure very well. She was awakened from anesthesia, extubated and taken to recovery in stable condition.   Uterine weight in the OR after the procedure was ended was 257gm.  COUNTS:  YES  PLAN OF CARE: Transfer to PACU

## 2015-02-17 NOTE — Anesthesia Procedure Notes (Signed)
Procedure Name: Intubation Date/Time: 02/17/2015 7:39 AM Performed by: Georgeanne Nim Pre-anesthesia Checklist: Patient identified, Emergency Drugs available, Suction available, Patient being monitored and Timeout performed Patient Re-evaluated:Patient Re-evaluated prior to inductionOxygen Delivery Method: Circle system utilized Preoxygenation: Pre-oxygenation with 100% oxygen Intubation Type: IV induction Ventilation: Mask ventilation without difficulty Laryngoscope Size: Mac and 3 Grade View: Grade III Tube type: Oral Number of attempts: 2 (first attempt by V.Mackinley Kiehn but unable to visualze cords;Dr. Tresa Moore then attempted and was able to place ETT but without view of cords) Airway Equipment and Method: Stylet Placement Confirmation: ETT inserted through vocal cords under direct vision,  positive ETCO2,  CO2 detector and breath sounds checked- equal and bilateral Secured at: 20 cm Tube secured with: Tape Dental Injury: Teeth and Oropharynx as per pre-operative assessment  Difficulty Due To: Difficulty was unanticipated

## 2015-02-17 NOTE — Progress Notes (Signed)
Day of Surgery Procedure(s) (LRB): HYSTERECTOMY TOTAL LAPAROSCOPIC (N/A) LAPAROSCOPIC BILATERAL SALPINGECTOMY (Bilateral) CYSTOSCOPY (N/A)  Subjective: Patient reports feeling waves of nausea with pain medication.  Pain is under good control.  No vaginal bleeding.     Objective: I have reviewed patient's vital signs, intake and output, medications and labs. Tmax: 98.2.  Pulse: 57-64.  Resp: 15-20, pulse ox: 99-100% RA UOP:  260cc since surgery  General: alert and cooperative Resp: clear to auscultation bilaterally Cardio: regular rate and rhythm, S1, S2 normal, no murmur, click, rub or gallop GI: soft, non-tender; bowel sounds normal; no masses,  no organomegaly and incision: clean, dry and intact Extremities: extremities normal, atraumatic, no cyanosis or edema Vaginal Bleeding: none  Assessment: s/p Procedure(s): HYSTERECTOMY TOTAL LAPAROSCOPIC (N/A) LAPAROSCOPIC BILATERAL SALPINGECTOMY (Bilateral) CYSTOSCOPY (N/A): stable  Plan: Encourage ambulation Continue IVF and IV toradol  CBC in am Will transition to regular food in AM Will remove foley catheter in AM     Hale Bogus [place] 02/17/2015, 6:19 PM

## 2015-02-17 NOTE — Anesthesia Postprocedure Evaluation (Signed)
  Anesthesia Post-op Note  Patient: Kayla Harris  Procedure(s) Performed: Procedure(s): HYSTERECTOMY TOTAL LAPAROSCOPIC (N/A) LAPAROSCOPIC BILATERAL SALPINGECTOMY (Bilateral) CYSTOSCOPY (N/A)  Patient Location: PACU  Anesthesia Type:General  Level of Consciousness: awake and alert   Airway and Oxygen Therapy: Patient Spontanous Breathing and Patient connected to nasal cannula oxygen  Post-op Pain: mild  Post-op Assessment: Post-op Vital signs reviewed, Patient's Cardiovascular Status Stable, Respiratory Function Stable, Patent Airway and No signs of Nausea or vomiting              Post-op Vital Signs: Reviewed and stable  Last Vitals:  Filed Vitals:   02/17/15 1045  BP: 94/55  Pulse: 62  Temp: 36.6 C  Resp: 15    Complications: No apparent anesthesia complications

## 2015-02-17 NOTE — H&P (Signed)
Kayla Harris is an 49 y.o. female G2P2 MWF here for TLH with bilateral salpingectomy, cystoscopy, and possible BSO due to symptomatic 5cm fundal fibroid.  Pt has experienced menorrhagia for years but began having pain associated with her fibroid this year.  The pain is localized right on top of the fundus of the uterus.  She did undergo a PUS showing gradual change in the fibroid over the last several years.  As well as an endometrial biopsy was done for pre-operative evaluation.  She is ready for definitive surgery.  Alteratives like oral anti-inflammatories or myomectomy have been dicussed but she is desirous of definitive treatment. She has also been counseled about risks and benefits and this is documented in office note from 01/28/15.    Ultrasound 12/19/14 showed uterus 9.9 x 6.7 x 6.0cm with 4.7cm fundal fibroid.  Pertinent Gynecological History: Menses: heavy lasting 5-7 days Bleeding: menorrhagia with regular cycles Contraception: none and pt had hx of BTL with reversal but was never able to get pregnant DES exposure: denies Blood transfusions: none Sexually transmitted diseases: no past history Previous GYN Procedures: none  Last mammogram: normal Date: 09/27/14  Last pap: normal Date: 11/28/14 with neg HR HPV OB History: G2, P2   Menstrual History: No LMP recorded.    Past Medical History  Diagnosis Date  . Migraines   . Hernia   . Anemia   . Arthritis   . Hypertension     no medication  . Neuromuscular disorder   . Asthma     hx  . Melanoma     X3  . PONV (postoperative nausea and vomiting)   . Melanoma 2000-2005    x3  . History of hemorrhoids   . Anxiety     Past Surgical History  Procedure Laterality Date  . Hernia repair    . Tubal reversal    . Melanoma excision      X3  . Tubal ligation  in 20's    And reverse tubal ligation  . Breast surgery      "uplift"  with saline implants  . Breast surgery  2002    lt lump-negative  . Breast biopsy Left 08/22/2012   Procedure: Needle localization removal left breast mass;  Surgeon: Haywood Lasso, MD;  Location: [facility];  Service: General;  Laterality: Left;  Needle localization removal left breast mass    Family History  Problem Relation Age of Onset  . Colon cancer Mother 4  . Colon cancer Father 67  . Cancer Paternal Aunt 75    Breast    Social History:  reports that she has never smoked. She has never used smokeless tobacco. She reports that she does not drink alcohol or use illicit drugs.  Allergies:  Allergies  Allergen Reactions  . Dilaudid [Hydromorphone Hcl] Itching  . Codeine Itching  . Flexeril [Cyclobenzaprine]     itching  . Imitrex [Sumatriptan]     Developed chest pain   . Other     Most pain medications itching  . Penicillins Nausea And Vomiting and Other (See Comments)    Bloody diarrhea    No prescriptions prior to admission    Review of Systems  All other systems reviewed and are negative.   There were no vitals taken for this visit. Physical Exam  Constitutional: She is oriented to person, place, and time. She appears well-developed and well-nourished.  Neck: Normal range of motion. Neck supple.  Cardiovascular: Normal rate and regular  rhythm.   Respiratory: Breath sounds normal.  GI: Soft. Bowel sounds are normal.  Neurological: She is alert and oriented to person, place, and time.  Skin: Skin is warm and dry.  Psychiatric: She has a normal mood and affect.    No results found for this or any previous visit (from the past 24 hour(s)).  No results found.  Assessment/Plan: 49 yo G2P2 MWF with symptomatic uterine fibroid and menorrhagia with associated pain here for definitive surgery with hysterectomy, bilateral salpingectomy, possible BSO, cystoscopy.  All questions answered.  Pt ready to proceed.  Hale Bogus [facility] 02/17/2015, 5:58 AM

## 2015-02-18 DIAGNOSIS — D259 Leiomyoma of uterus, unspecified: Secondary | ICD-10-CM | POA: Diagnosis not present

## 2015-02-18 LAB — CBC
HCT: 28.4 % — ABNORMAL LOW (ref 36.0–46.0)
Hemoglobin: 10 g/dL — ABNORMAL LOW (ref 12.0–15.0)
MCH: 30.7 pg (ref 26.0–34.0)
MCHC: 35.2 g/dL (ref 30.0–36.0)
MCV: 87.1 fL (ref 78.0–100.0)
PLATELETS: 144 10*3/uL — AB (ref 150–400)
RBC: 3.26 MIL/uL — ABNORMAL LOW (ref 3.87–5.11)
RDW: 12.5 % (ref 11.5–15.5)
WBC: 8 10*3/uL (ref 4.0–10.5)

## 2015-02-18 MED ORDER — IBUPROFEN 800 MG PO TABS: 800.0000 mg | ORAL_TABLET | Freq: Four times a day (QID) | ORAL | Status: DC | PRN

## 2015-02-18 NOTE — Progress Notes (Signed)
1 Day Post-Op Procedure(s) (LRB): HYSTERECTOMY TOTAL LAPAROSCOPIC (N/A) LAPAROSCOPIC BILATERAL SALPINGECTOMY (Bilateral) CYSTOSCOPY (N/A)  Subjective: Patient reports no nausea.  Pain is much improved.  Minimal spotting.  Has eaten breakfast.  +flatus.  Objective: I have reviewed patient's vital signs, intake and output, medications and labs. AF/VSS, UOP is excellent  General: alert and cooperative Resp: clear to auscultation bilaterally Cardio: regular rate and rhythm, S1, S2 normal, no murmur, click, rub or gallop GI: soft, non-tender; bowel sounds normal; no masses,  no organomegaly and incision: clean, dry and intact Extremities: extremities normal, atraumatic, no cyanosis or edema Vaginal Bleeding: minimal  Assessment: s/p Procedure(s): HYSTERECTOMY TOTAL LAPAROSCOPIC (N/A) LAPAROSCOPIC BILATERAL SALPINGECTOMY (Bilateral) CYSTOSCOPY (N/A): progressing well  Plan: Encourage ambulation Voiding trial this am  Discharge home later today     Lyman Speller 02/18/2015, 8:48 AM

## 2015-02-18 NOTE — Discharge Instructions (Signed)
Post Op Hysterectomy Instructions Please read the instructions below. Refer to these instructions for the next few weeks. These instructions provide you with general information on caring for yourself after surgery. Your caregiver may also give you specific instructions. While your treatment has been planned according to the most current medical practices available, unavoidable problems sometimes happen. If you have any problems or questions after you leave, please call your caregiver.  HOME CARE INSTRUCTIONS Healing will take time. You will have discomfort, tenderness, swelling and bruising at the operative site for a couple of weeks. This is normal and will get better as time goes on.   Only take over-the-counter or prescription medicines for pain, discomfort or fever as directed by your caregiver.   Do not take aspirin. It can cause bleeding.   Do not drive when taking pain medication.   Follow your caregivers advice regarding diet, exercise, lifting, driving and general activities.   Resume your usual diet as directed and allowed.   Get plenty of rest and sleep.   Do not douche, use tampons, or have sexual intercourse until your caregiver gives you permission. .   Take your temperature if you feel hot or flushed.   You may shower today when you get home.  No tub bath for one week.    Do not drink alcohol until you are not taking any narcotic pain medications.   Try to have someone home with you for a week or two to help with the household activities.   Be careful over the next two to three weeks with any activities at home that involve lifting, pushing, or pulling.  Listen to your body--if something feels uncomfortable to do, then don't do it.  Make sure you and your family understands everything about your operation and recovery.   Walking up stairs is fine.  Do not sign any legal documents until you feel normal again.   Keep all your follow-up appointments as recommended by  your caregiver.   Remove your dressing on Thursday.  PLEASE CALL THE OFFICE IF:  There is swelling, redness or increasing pain in the wound area.   Pus is coming from the wound.   You notice a bad smell from the wound or surgical dressing.   You have pain, redness and swelling from the intravenous site.   The wound is breaking open (the edges are not staying together).    You develop pain or bleeding when you urinate.   You develop abnormal vaginal discharge.   You have any type of abnormal reaction or develop an allergy to your medication.   You need stronger pain medication for your pain   SEEK IMMEDIATE MEDICAL CARE:  You develop a temperature of 100.5 or higher.   You develop abdominal pain.   You develop chest pain.   You develop shortness of breath.   You pass out.   You develop pain, swelling or redness of your leg.   You develop heavy vaginal bleeding with or without blood clots.   MEDICATIONS:  Restart your regular medications BUT wait one week before restarting all vitamins and mineral supplements  Use Motrin 800mg  every 8 hours for the next several days.  You can also use the Ultram with this  You may use an over the counter stool softener like Colace or Dulcolax to help with starting a bowel movement.  Start the day after you go home.  Warm liquids, fluids, and ambulation help too.  If you have not had  a bowel movement in four days, you need to call the office.

## 2015-02-18 NOTE — Discharge Summary (Signed)
Physician Discharge Summary  Patient ID: Kayla Harris MRN: [id number] DOB/AGE: [birth year] 49 y.o.  Admit date: 02/17/2015 Discharge date: 02/18/2015  Admission Diagnoses: enlarged uterus, fibroid uterus, menorrhagia, pelvic pain  Discharge Diagnoses:  Active Problems:   Menorrhagia   Endometriosis   Female pelvic pain   Discharged Condition: good  Hospital Course: Patient admitted through same day surgery.  She was taken to OR where TLH/Bilateral salpingectomy/cystoscopy were performed.  Surgical findings included enlarged uterus due to fibroids.  Surgery was uneventful.  EBL 75cc.  Patient transferred to PACU where she was stable and then to 3rd floor for the remainder of her hospitalization.  During her post-op recovery, her vitals and stable and she was AF.  In evening of POD#0, she was having some issues with nausea.  She was on clears but she did not want regular food at this time.  Her exam was normal and appropriate.  She was able to ambulate and she had adequate pain control.  Patient seen both in the evening of POD#0 and AM of POD#1.  In the AM of POD#1, she was feeling much better.  Her nausea had resolved.  Her foley was removed and she was able to void.  She had passed flatus.  She had good pain control just with Toradol.  Post op hb was 10.0, decreased from 12.0, pre-operatively.  At this point, patient was voiding, walking, having excellent pain control, had no nausea, and minimal vaginal bleeding.  She was ready for D/C.  Consults: None  Significant Diagnostic Studies: labs: post op hb 10.0  Treatments: surgery: TLH/Bilateral salpingectomy/cystoscopy  Discharge Exam: Blood pressure 102/55, pulse 69, temperature 98.5 F (36.9 C), temperature source Oral, resp. rate 16, height 5\' 3"  (1.6 m), weight 109 lb (49.442 kg), SpO2 100 %. General appearance: alert, cooperative and no distress Resp: clear to auscultation bilaterally Cardio: regular rate and rhythm, S1, S2 normal, no  murmur, click, rub or gallop GI: soft, non-tender; bowel sounds normal; no masses,  no organomegaly Extremities: extremities normal, atraumatic, no cyanosis or edema Incision/Wound:c/d/i  Disposition: 01-Home or Self Care     Medication List    STOP taking these medications        ALEVE PO      TAKE these medications        diphenhydramine-acetaminophen 25-500 MG Tabs  Commonly known as:  TYLENOL PM  Take 2 tablets by mouth at bedtime as needed (pain).     ibuprofen 800 MG tablet  Commonly known as:  ADVIL,MOTRIN  Take 1 tablet (800 mg total) by mouth every 6 (six) hours as needed.     SEVERE SINUS CONGESTION & PAIN PO  Take 1 tablet by mouth as needed (migrains).     traMADol 50 MG tablet  Commonly known as:  ULTRAM  Take 1 tablet (50 mg total) by mouth every 6 (six) hours as needed. Can take 2 tablets every 6 hours for a max dose of 400mg /day, if needed.           Follow-up Information    Follow up with Lyman Speller, MD On 02/25/2015.   Specialty:  Gynecology   Why:  appt time 2:30pm   Contact information:   [address] [place] [address] [phone]       Signed: Lyman Speller 02/18/2015, 9:14 AM

## 2015-02-18 NOTE — Progress Notes (Signed)
Patient discharged home with husband... Discharge instructions reviewed with patient and she verbalized understanding... Condition stable... No equipment... Ambulated to car with A. Lysbeth Galas, [place].

## 2015-02-19 ENCOUNTER — Encounter (HOSPITAL_COMMUNITY): Payer: Self-pay | Admitting: Obstetrics & Gynecology

## 2015-02-20 ENCOUNTER — Telehealth: Payer: Self-pay | Admitting: Obstetrics & Gynecology

## 2015-02-20 NOTE — Telephone Encounter (Signed)
Patient had surgery this week. She said, "I am just not feeling well today. I threw up this morning and am coughing a lot. I just want Dr. Sabra Heck to know."

## 2015-02-20 NOTE — Telephone Encounter (Signed)
Left message to call Kaitlyn at [phone]. 

## 2015-02-21 NOTE — Telephone Encounter (Signed)
Spoke with patient. Patient states that she is feeling a lot better today. States yesterday she threw up twice and was not feeling well. Felt very low on energy and like she was getting the flu. Today she has been able to get up and eat breakfast. Denies any vomiting today. Denies any fevers or chills. "I am more alert today and feel a lot better." Patient states she has not had a bowel movement but took two OTC stool softeners yesterday to help. Advised to drink plenty of fluid and walk as much as possible but not to over do it as this will help to stimulate bowels. Patient is agreeable. Advised if she develops any new symptoms or begins to have vomiting again will need to give our office a call. Patient is agreeable. Asking if she may keep her bandage on for a few more days. "I feel queasy about removing it today." Advised okay to leave on for another day or two until she feels well to remove.  Routing to [place] for review before closing.

## 2015-02-21 NOTE — Telephone Encounter (Signed)
Spoke with pt.  States she is feeling much better than yesterday.  Working on having a bowel movement.  No nausea.  No vaginal bleeding.  No trouble with bladder.  No fever.  Pathology reviewed.  All questions answered.  Has follow up next week.  Ok to close encounter.

## 2015-02-25 ENCOUNTER — Ambulatory Visit (INDEPENDENT_AMBULATORY_CARE_PROVIDER_SITE_OTHER): Payer: BLUE CROSS/BLUE SHIELD | Admitting: Obstetrics & Gynecology

## 2015-02-25 ENCOUNTER — Encounter: Payer: Self-pay | Admitting: Obstetrics & Gynecology

## 2015-02-25 ENCOUNTER — Telehealth: Payer: Self-pay | Admitting: Obstetrics & Gynecology

## 2015-02-25 VITALS — BP 110/60 | HR 72 | Resp 14 | Wt 107.0 lb

## 2015-02-25 DIAGNOSIS — N809 Endometriosis, unspecified: Secondary | ICD-10-CM

## 2015-02-25 DIAGNOSIS — Z9889 Other specified postprocedural states: Secondary | ICD-10-CM

## 2015-02-25 NOTE — Progress Notes (Signed)
Patient ID: Kayla Harris, female   DOB: May 29, 1966, 49 y.o.   MRN: [id number]   Post Operative Visit  Procedure: Laparoscopic  Hysterectomy Days Post-op: 7 day   Subjective: Doing well.  Reports her energy level is down but this is something she expected.  No vaginal bleeding.  Had bowel movement on Saturday.  Now having daily BM.  She is not having any trouble with emptying her bladder.  Reviewed pathology report with pt.      Objective: BP 110/60 mmHg  Pulse 72  Resp 14  Wt 107 lb (48.535 kg)  LMP 01/27/2015  EXAM General: alert and cooperative Resp: clear to auscultation bilaterally Cardio: regular rate and rhythm, S1, S2 normal, no murmur, click, rub or gallop GI: soft, non-tender; bowel sounds normal; no masses,  no organomegaly and incision: clean, dry and intact Extremities: extremities normal, atraumatic, no cyanosis or edema Vaginal Bleeding: none  Assessment: s/p TLH/Bilateral salpingectomy/cystoscopy  Plan: Recheck 3 weeks

## 2015-02-25 NOTE — Telephone Encounter (Signed)
Patient has an appointment for a one week post op today and wants to know if it is okay to drive herself. She said, "I have not taken any narcotics so I think I will be fine. I am having a hard time getting a ride."

## 2015-02-25 NOTE — Telephone Encounter (Signed)
Spoke with patient. Advised we checked with Dr.Miller and it is okay for her to drive to her appointment today. Patient is agreeable.  Routing to [place] for review before closing.

## 2015-02-25 NOTE — Telephone Encounter (Signed)
Patient returned call to check on status. Patient had surgery on 02/17/2015. Patient lives 50 minutes from our office and would like to know if she can drive herself. States she does not have anyone else to bring her to her appointment today at 2:15 pm with Dr.Miller. States she has not taken any narcotics since surgery. Advised I will need to speak with Dr.Miller and return phone call. Patient is agreeable.

## 2015-02-28 ENCOUNTER — Other Ambulatory Visit: Payer: Self-pay | Admitting: Obstetrics & Gynecology

## 2015-02-28 DIAGNOSIS — N6001 Solitary cyst of right breast: Secondary | ICD-10-CM

## 2015-03-24 ENCOUNTER — Ambulatory Visit: Payer: BLUE CROSS/BLUE SHIELD | Admitting: Obstetrics & Gynecology

## 2015-03-26 ENCOUNTER — Ambulatory Visit
Admission: RE | Admit: 2015-03-26 | Discharge: 2015-03-26 | Disposition: A | Discharge status: Home / Self Care | Payer: BLUE CROSS/BLUE SHIELD | Source: Ambulatory Visit | Attending: Obstetrics & Gynecology | Admitting: Obstetrics & Gynecology

## 2015-03-26 ENCOUNTER — Encounter: Payer: Self-pay | Admitting: Obstetrics & Gynecology

## 2015-03-26 ENCOUNTER — Ambulatory Visit (INDEPENDENT_AMBULATORY_CARE_PROVIDER_SITE_OTHER): Payer: BLUE CROSS/BLUE SHIELD | Admitting: Obstetrics & Gynecology

## 2015-03-26 VITALS — BP 112/68 | HR 64 | Resp 16 | Wt 113.0 lb

## 2015-03-26 DIAGNOSIS — N6001 Solitary cyst of right breast: Secondary | ICD-10-CM

## 2015-03-26 DIAGNOSIS — Z9889 Other specified postprocedural states: Secondary | ICD-10-CM

## 2015-03-26 MED ORDER — TEMAZEPAM 7.5 MG PO CAPS: 7.5000 mg | ORAL_CAPSULE | Freq: Every evening | ORAL | Status: DC | PRN

## 2015-03-26 NOTE — Progress Notes (Signed)
Post Operative Visit  Procedure:TLH/Bilateral salpingectomy/cystoscopy Days Post-op: 5 weeks  Subjective: Pt reports her two biggest issues are insomnia and subsequent difficulty with focusing.  Also, feeling a little more anxiety.  Does not feel "moody" and is not crying more than normal.  Reports she just feels "bla".  Has tried Nyquil and then helped her sleep for a few hours.    Pt reports she's never been a very good sleeper but it is definitely worse.    Had some spotting between 2-4 weeks.  This was very light.  Spotted again, just a little, on Monday.      Last time she took any motrin was about a week ago.  Really not having any pain.    Bladder function is much better.  Pt feels like she can void much more normally and doesn't have the frequency like she was having before surgery.  Bowel movements are normal.    Objective: LMP 01/27/2015  EXAM General: alert and cooperative Resp: clear to auscultation bilaterally Cardio: regular rate and rhythm, S1, S2 normal, no murmur, click, rub or gallop GI: soft, non-tender; bowel sounds normal; no masses,  no organomegaly and incision: clean, dry and intact Extremities: extremities normal, atraumatic, no cyanosis or edema Vaginal Bleeding: none  Gyn:  NAEFG, vaginal pink and moist, cuff well approximated, scant old blood at cuff, no mass/tenderness/fullnexx at cuff  Assessment: s/p  TLH/bilateral salpingectomy  Plan: Recheck 3-4 weeks Trial of restoril 7.5mg  nightly prn insomnia. #30/0RF

## 2015-04-18 ENCOUNTER — Ambulatory Visit (INDEPENDENT_AMBULATORY_CARE_PROVIDER_SITE_OTHER): Payer: BLUE CROSS/BLUE SHIELD | Admitting: Obstetrics & Gynecology

## 2015-04-18 ENCOUNTER — Encounter: Payer: Self-pay | Admitting: Obstetrics & Gynecology

## 2015-04-18 VITALS — BP 110/76 | HR 64 | Resp 16 | Wt 111.0 lb

## 2015-04-18 DIAGNOSIS — Z9889 Other specified postprocedural states: Secondary | ICD-10-CM

## 2015-04-18 MED ORDER — ZALEPLON 5 MG PO CAPS: 5.0000 mg | ORAL_CAPSULE | Freq: Every evening | ORAL | Status: DC | PRN

## 2015-04-18 NOTE — Progress Notes (Signed)
Post Operative Visit  Procedure: TLH, Left salpingectomy, cystoscopy Days Post-op: 60  Subjective: Pt doing well.  No vaginal bleeding.  Still having some trouble sleeping.  She is taking melatonin and bendryl and gets 3-4 hours but doesn't sleep through the night.  Also having some mild LLQ pain.  Having increased bowel spasm.  Has colonoscopy scheduled in December with Dr. Collene Mares.  H/O polyp removed three years ago.    Energy is better.  Pt reported first week back to work was hard but she "got through it".  This is her third week back.  She is walking regularly for exercise.    Bladder function is better--going for longer periods of time between voids.    Objective: LMP 01/27/2015  EXAM General: alert and cooperative Resp: clear to auscultation bilaterally Cardio: regular rate and rhythm, S1, S2 normal, no murmur, click, rub or gallop GI: soft, non-tender; bowel sounds normal; no masses,  no organomegaly and incision: clean, dry and intact Extremities: extremities normal, atraumatic, no cyanosis or edema Vaginal Bleeding: none  Gyn: well healed cuff, no fullness, masses, tenderness  Assessment: s/p Laparoscopic TLH/Bilateral salpingectomy/cystscopy insomnia  Plan: Trial of Sonata 5mg  qhs.  #30/0RF.  Pt will call and give update. Ok for pt to have intercourse Follow up exam scheduled for next year.  Pt knows if she has any issues, she should call.

## 2015-05-19 IMAGING — MG MM SCREENING BREAST W/IMPLANT TOMO BILATERAL
8 of 13 series · 8 of 29 positions shown · non-contrast
Comparison: Previous exam(s).

CLINICAL DATA: Screening.

EXAM:
DIGITAL SCREENING BILATERAL MAMMOGRAM WITH IMPLANTS, 3D TOMO WITH
CAD
The patient has retropectoral implants. Standard and implant
displaced views were performed.

[L MLO (1 of 2)]
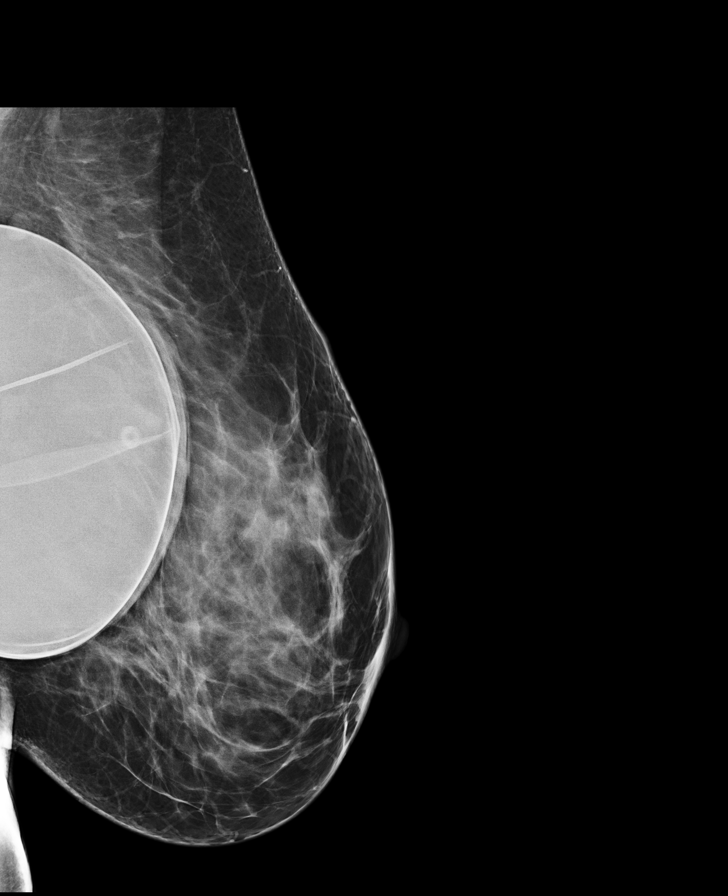

[R CC (1 of 3)]
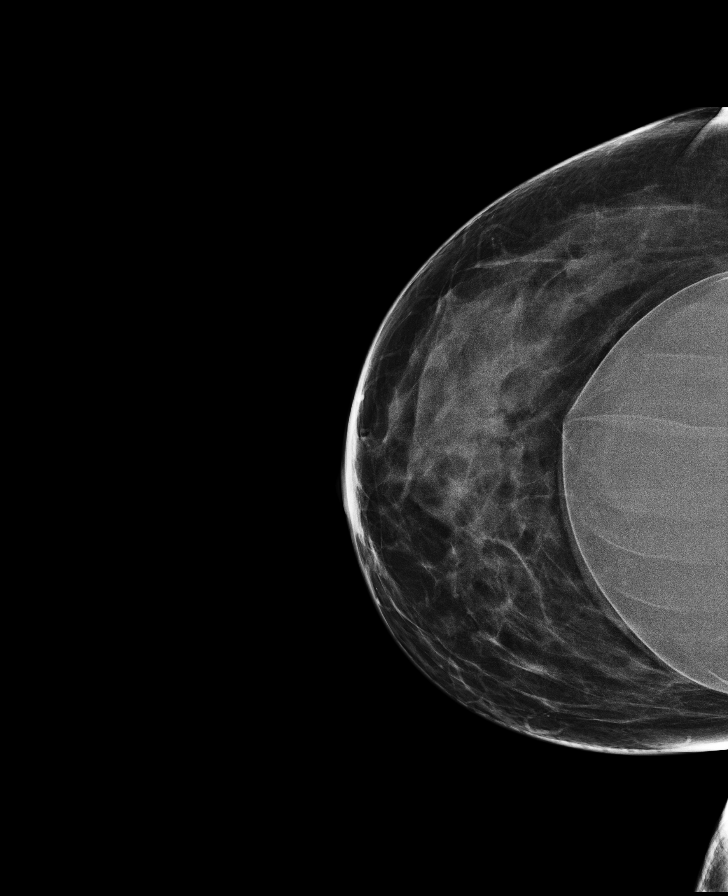

[L CC (1 of 2)]
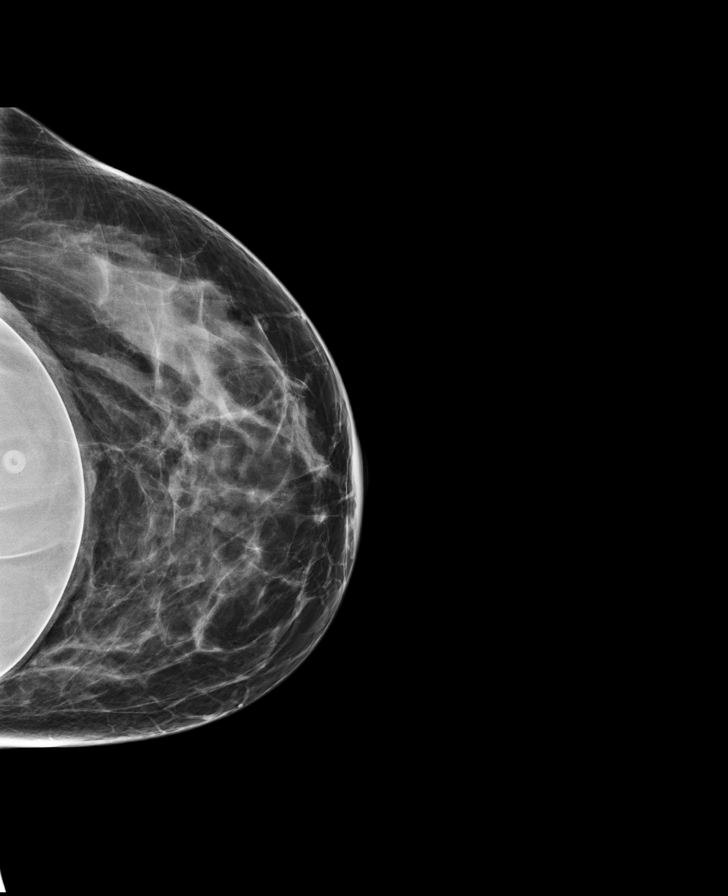

[R MLO]
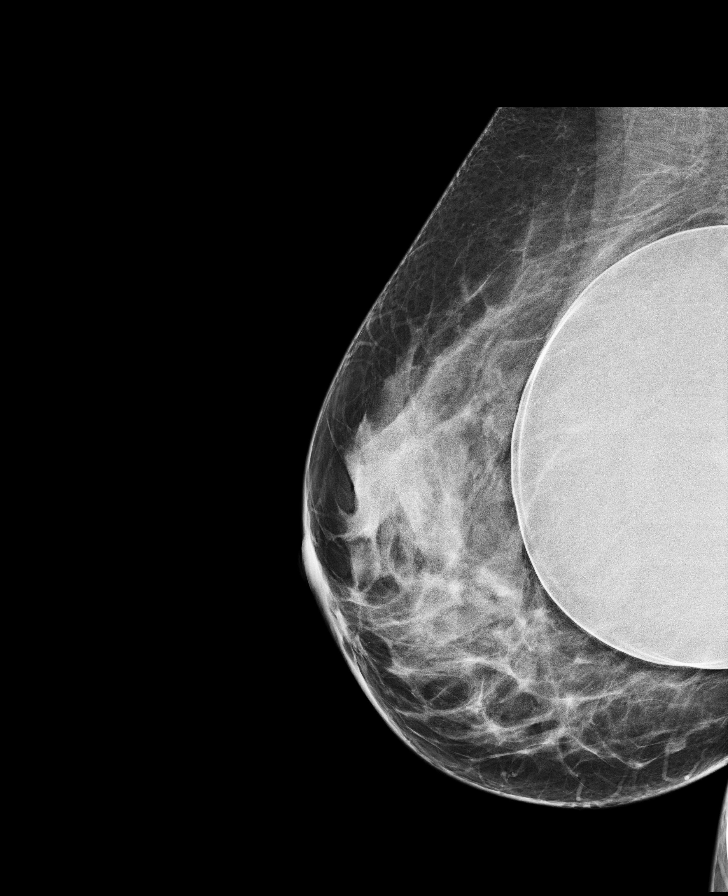

[R CC (2 of 3)]
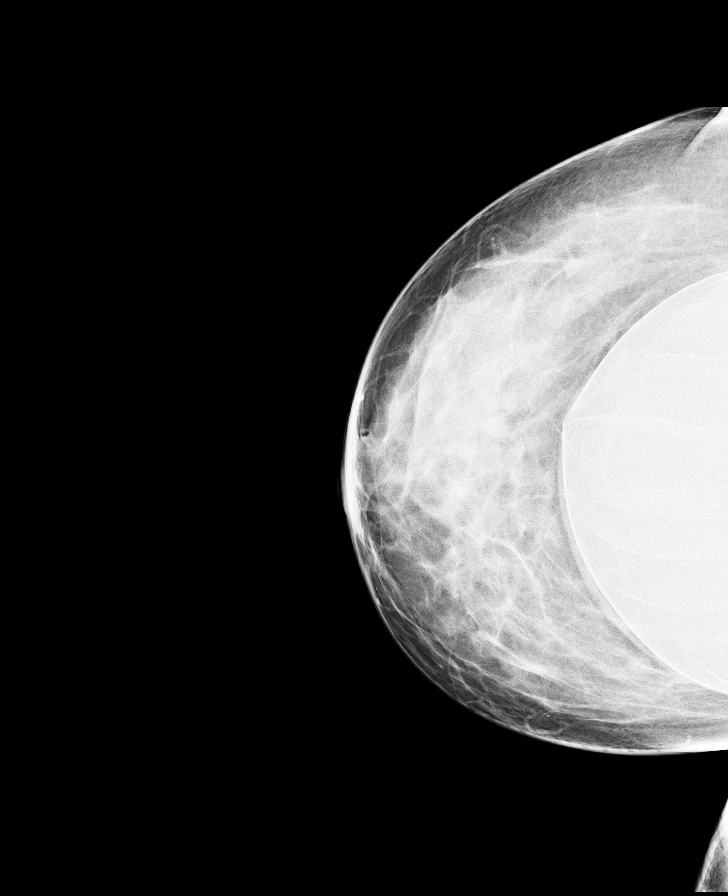

[L CC (2 of 2)]
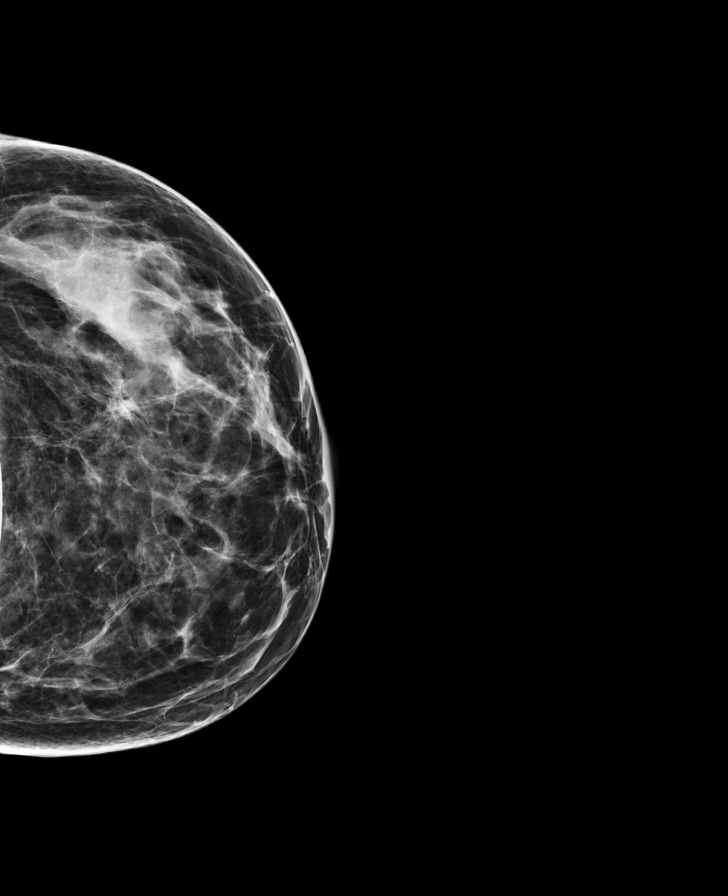

[R CC (3 of 3)]
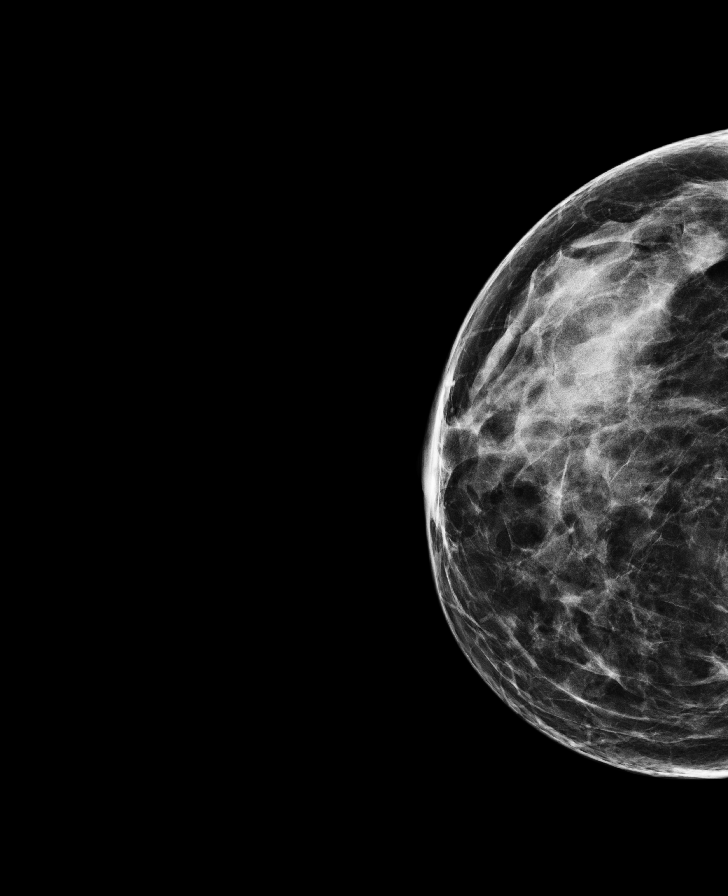

[L MLO (2 of 2)]
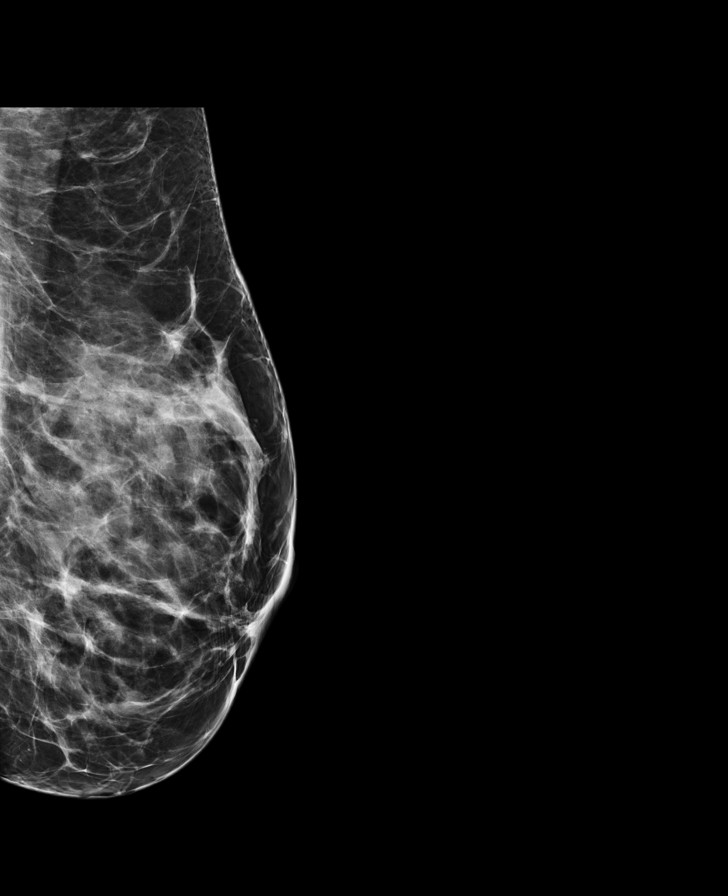

[8 of 29 positions shown; findings below may reference images not displayed]

ACR Breast Density Category c: The breasts are heterogeneously
dense, which may obscure small masses.
FINDINGS: In the left breast, a possible asymmetry warrants further evaluation
with spot compression views and possibly ultrasound. In the right
breast, no suspicious masses or malignant type calcifications are
identified. Images were processed with CAD.
IMPRESSION: Further evaluation is suggested for possible asymmetry in the left
breast.

RECOMMENDATION:
Diagnostic mammogram and possibly ultrasound of the left breast.
(Code:EA-A-UUR)

The patient will be contacted regarding the findings, and additional
imaging will be scheduled.

BI-RADS CATEGORY  0: Incomplete. Need additional imaging evaluation
and/or prior mammograms for comparison.

## 2015-08-21 ENCOUNTER — Other Ambulatory Visit: Payer: Self-pay | Admitting: Obstetrics & Gynecology

## 2015-08-21 DIAGNOSIS — R921 Mammographic calcification found on diagnostic imaging of breast: Secondary | ICD-10-CM

## 2015-08-21 DIAGNOSIS — N63 Unspecified lump in unspecified breast: Secondary | ICD-10-CM

## 2015-09-26 ENCOUNTER — Ambulatory Visit
Admission: RE | Admit: 2015-09-26 | Discharge: 2015-09-26 | Disposition: A | Discharge status: Home / Self Care | Payer: BLUE CROSS/BLUE SHIELD | Source: Ambulatory Visit | Attending: Obstetrics & Gynecology | Admitting: Obstetrics & Gynecology

## 2015-09-26 DIAGNOSIS — R921 Mammographic calcification found on diagnostic imaging of breast: Secondary | ICD-10-CM

## 2015-09-26 DIAGNOSIS — M4722 Other spondylosis with radiculopathy, cervical region: Secondary | ICD-10-CM | POA: Diagnosis not present

## 2015-10-08 DIAGNOSIS — Z713 Dietary counseling and surveillance: Secondary | ICD-10-CM | POA: Diagnosis not present

## 2015-10-22 ENCOUNTER — Telehealth: Payer: Self-pay | Admitting: Obstetrics & Gynecology

## 2015-10-22 NOTE — Telephone Encounter (Signed)
Left message for patient to call and reschedule 12/04/15 aex with Dr.Miller.

## 2015-10-31 NOTE — Telephone Encounter (Signed)
2nd call to patient. Left message regarding upcoming appointment has been canceled and needs to be rescheduled.

## 2015-12-04 ENCOUNTER — Ambulatory Visit: Payer: Self-pay | Admitting: Obstetrics & Gynecology

## 2015-12-10 DIAGNOSIS — Z713 Dietary counseling and surveillance: Secondary | ICD-10-CM | POA: Diagnosis not present

## 2016-01-08 DIAGNOSIS — F419 Anxiety disorder, unspecified: Secondary | ICD-10-CM | POA: Diagnosis not present

## 2016-01-08 DIAGNOSIS — H9202 Otalgia, left ear: Secondary | ICD-10-CM | POA: Diagnosis not present

## 2016-01-09 ENCOUNTER — Emergency Department (HOSPITAL_COMMUNITY): Payer: BLUE CROSS/BLUE SHIELD

## 2016-01-09 ENCOUNTER — Encounter (HOSPITAL_COMMUNITY): Payer: Self-pay | Admitting: *Deleted

## 2016-01-09 ENCOUNTER — Emergency Department (HOSPITAL_COMMUNITY)
Admission: EM | Admit: 2016-01-09 | Discharge: 2016-01-09 | Disposition: A | Discharge status: Home / Self Care | Payer: BLUE CROSS/BLUE SHIELD | Attending: Emergency Medicine | Admitting: Emergency Medicine

## 2016-01-09 DIAGNOSIS — I10 Essential (primary) hypertension: Secondary | ICD-10-CM | POA: Diagnosis not present

## 2016-01-09 DIAGNOSIS — R079 Chest pain, unspecified: Secondary | ICD-10-CM | POA: Insufficient documentation

## 2016-01-09 DIAGNOSIS — M1990 Unspecified osteoarthritis, unspecified site: Secondary | ICD-10-CM | POA: Insufficient documentation

## 2016-01-09 DIAGNOSIS — R072 Precordial pain: Secondary | ICD-10-CM | POA: Diagnosis not present

## 2016-01-09 DIAGNOSIS — J45909 Unspecified asthma, uncomplicated: Secondary | ICD-10-CM | POA: Insufficient documentation

## 2016-01-09 DIAGNOSIS — G514 Facial myokymia: Secondary | ICD-10-CM

## 2016-01-09 DIAGNOSIS — R200 Anesthesia of skin: Secondary | ICD-10-CM | POA: Diagnosis not present

## 2016-01-09 DIAGNOSIS — R253 Fasciculation: Secondary | ICD-10-CM | POA: Insufficient documentation

## 2016-01-09 HISTORY — DX: Paresthesia of skin: R20.2

## 2016-01-09 HISTORY — DX: Pain in unspecified joint: M25.50

## 2016-01-09 HISTORY — DX: Other cervical disc degeneration, unspecified cervical region: M50.30

## 2016-01-09 HISTORY — DX: Other fatigue: R53.83

## 2016-01-09 HISTORY — DX: Other visual disturbances: H53.8

## 2016-01-09 LAB — BASIC METABOLIC PANEL
ANION GAP: 6 (ref 5–15)
BUN: 12 mg/dL (ref 6–20)
CHLORIDE: 103 mmol/L (ref 101–111)
CO2: 25 mmol/L (ref 22–32)
Calcium: 8.9 mg/dL (ref 8.9–10.3)
Creatinine, Ser: 0.7 mg/dL (ref 0.44–1.00)
GFR calc non Af Amer: >60 mL/min (ref >60)
Glucose, Bld: 92 mg/dL (ref 65–99)
POTASSIUM: 4.1 mmol/L (ref 3.5–5.1)
SODIUM: 134 mmol/L — AB (ref 135–145)

## 2016-01-09 LAB — CBC
HEMATOCRIT: 37.8 % (ref 36.0–46.0)
Hemoglobin: 13.3 g/dL (ref 12.0–15.0)
MCH: 31 pg (ref 26.0–34.0)
MCHC: 35.2 g/dL (ref 30.0–36.0)
MCV: 88.1 fL (ref 78.0–100.0)
PLATELETS: 242 10*3/uL (ref 150–400)
RBC: 4.29 MIL/uL (ref 3.87–5.11)
RDW: 11.9 % (ref 11.5–15.5)
WBC: 9 10*3/uL (ref 4.0–10.5)

## 2016-01-09 LAB — TROPONIN I: Troponin I: <0.03 ng/mL (ref <0.03)

## 2016-01-09 MED ORDER — ACETAMINOPHEN 500 MG PO TABS
1000.0000 mg | ORAL_TABLET | Freq: Once | ORAL | Status: AC
  Administered 2016-01-09: 1000 mg via ORAL
  Filled 2016-01-09: qty 2

## 2016-01-09 MED ORDER — ANTIPYRINE-BENZOCAINE 5.4-1.4 % OT SOLN: 2.0000 [drp] | OTIC | Status: DC | PRN

## 2016-01-09 NOTE — ED Provider Notes (Signed)
CSN: [id number]     Arrival date & time 01/09/16  1416 History   First MD Initiated Contact with Patient 01/09/16 1554     Chief Complaint  Patient presents with  . Chest Pain     (Consider location/radiation/quality/duration/timing/severity/associated sxs/prior Treatment) HPI....Kayla KitchenMarland KitchenPatient presents with intermittent chest pain for 2 months not associated with any activity. No crushing substernal chest pain, diaphoresis, dyspnea, nausea. She is concerned about her chronic right eye twitching, occasional blurred vision, occasional garbled speech. She has no history of coronary artery disease or stroke. No smoking. No family history of early MI. Review of systems positive for left earache for which she is taking Zithromax. Severity of symptoms mild to moderate. Nothing makes symptoms better or worse.  Past Medical History  Diagnosis Date  . Migraines   . Hernia   . Anemia   . Arthritis   . Hypertension     no medication  . Neuromuscular disorder ([place])   . Asthma     hx  . Melanoma ([place])     X3  . PONV (postoperative nausea and vomiting)   . Melanoma ([place]) 2000-2005    x3  . History of hemorrhoids   . Anxiety   . Paresthesias   . Fatigue   . Blurred vision   . Joint pain   . DDD (degenerative disc disease), cervical    Past Surgical History  Procedure Laterality Date  . Hernia repair    . Tubal reversal    . Melanoma excision      X3  . Tubal ligation  in 20's    And reverse tubal ligation  . Breast surgery      "uplift"  with saline implants  . Breast surgery  2002    lt lump-negative  . Breast biopsy Left 08/22/2012    Procedure: Needle localization removal left breast mass;  Surgeon: Haywood Lasso, MD;  Location: [facility];  Service: General;  Laterality: Left;  Needle localization removal left breast mass  . Laparoscopic hysterectomy N/A 02/17/2015    Procedure: HYSTERECTOMY TOTAL LAPAROSCOPIC;  Surgeon: Megan Salon, MD;  Location: [facility];   Service: Gynecology;  Laterality: N/A;  . Laparoscopic bilateral salpingectomy Bilateral 02/17/2015    Procedure: LAPAROSCOPIC BILATERAL SALPINGECTOMY;  Surgeon: Megan Salon, MD;  Location: [facility];  Service: Gynecology;  Laterality: Bilateral;  . Cystoscopy N/A 02/17/2015    Procedure: CYSTOSCOPY;  Surgeon: Megan Salon, MD;  Location: [facility];  Service: Gynecology;  Laterality: N/A;  . Abdominal hysterectomy     Family History  Problem Relation Age of Onset  . Colon cancer Mother 38  . Colon cancer Father 81  . Cancer Paternal Aunt 34    Breast   Social History  Substance Use Topics  . Smoking status: Never Smoker   . Smokeless tobacco: Never Used  . Alcohol Use: No   OB History    Gravida Para Term Preterm AB TAB SAB Ectopic Multiple Living   2 2 2       2      Review of Systems  All other systems reviewed and are negative.     Allergies  Dilaudid; Codeine; Flexeril; Imitrex; Other; Penicillins; and Ultram  Home Medications   Prior to Admission medications   Medication Sig Start Date End Date Taking? Authorizing Provider  antipyrine-benzocaine Toniann Fail) otic solution Place 2 drops into the left ear every 2 (two) hours as needed for ear pain. 01/09/16   Nat Christen, MD  diphenhydrAMINE (BENADRYL) 25 MG tablet Take 25 mg by mouth every 6 (six) hours as needed.    Historical Provider, MD  diphenhydramine-acetaminophen (TYLENOL PM) 25-500 MG TABS Take 2 tablets by mouth at bedtime as needed (pain).    Historical Provider, MD  ibuprofen (ADVIL,MOTRIN) 800 MG tablet Take 1 tablet (800 mg total) by mouth every 6 (six) hours as needed. 02/18/15   Megan Salon, MD  MELATONIN PO Take by mouth.    Historical Provider, MD  Phenylephrine-APAP-Guaifenesin (SEVERE SINUS CONGESTION & PAIN PO) Take 1 tablet by mouth as needed (migrains).     Historical Provider, MD  Pseudoeph-Doxylamine-DM-APAP (NYQUIL PO) Take by mouth.    Historical Provider, MD  zaleplon (SONATA) 5 MG capsule Take 1  capsule (5 mg total) by mouth at bedtime as needed for sleep. 04/18/15   Megan Salon, MD   BP 121/83 mmHg  Pulse 61  Temp(Src) 97.7 F (36.5 C) (Oral)  Resp 18  Ht 5\' 3"  (1.6 m)  Wt 116 lb (52.617 kg)  BMI 20.55 kg/m2  SpO2 100%  LMP 01/27/2015 Physical Exam  Constitutional: She is oriented to person, place, and time. She appears well-developed and well-nourished.  HENT:  Head: Normocephalic and atraumatic.  Left tympanic membrane examined and appears normal.  Eyes: Conjunctivae are normal.  Neck: Neck supple.  Cardiovascular: Normal rate and regular rhythm.   Pulmonary/Chest: Effort normal and breath sounds normal.  Abdominal: Soft. Bowel sounds are normal.  Musculoskeletal: Normal range of motion.  Neurological: She is alert and oriented to person, place, and time.  Skin: Skin is warm and dry.  Psychiatric: She has a normal mood and affect. Her behavior is normal.  Nursing note and vitals reviewed.   ED Course  Procedures (including critical care time) Labs Review Labs Reviewed  BASIC METABOLIC PANEL - Abnormal; Notable for the following:    Sodium 134 (*)    All other components within normal limits  CBC  TROPONIN I    Imaging Review Dg Chest 2 View  01/09/2016  CLINICAL DATA:  Chest pain EXAM: CHEST  2 VIEW COMPARISON:  CT chest 01/02/2013 FINDINGS: The heart size and mediastinal contours are within normal limits. Both lungs are clear. The visualized skeletal structures are unremarkable. IMPRESSION: No active cardiopulmonary disease. Electronically Signed   By: Kathreen Devoid   On: 01/09/2016 15:07   Ct Head Wo Contrast  01/09/2016  CLINICAL DATA:  Weakness.  Right arm numbness.  Loss of hearing. EXAM: CT HEAD WITHOUT CONTRAST TECHNIQUE: Contiguous axial images were obtained from the base of the skull through the vertex without intravenous contrast. COMPARISON:  None. FINDINGS: Brain: No evidence of acute infarction, hemorrhage, extra-axial collection,  ventriculomegaly, or mass effect. Vascular: No hyperdense vessel or unexpected calcification. Skull: Negative for fracture or focal lesion. Sinuses/Orbits: Normal orbits and globes. Clear paranasal sinuses and mastoid air cells. Other: None. IMPRESSION: Normal head CT. Electronically Signed   By: Abigail Miyamoto M.D.   On: 01/09/2016 19:37   I have personally reviewed and evaluated these images and lab results as part of my medical decision-making.   EKG Interpretation   Date/Time:  Friday January 09 2016 14:27:02 EDT Ventricular Rate:  68 PR Interval:  118 QRS Duration: 76 QT Interval:  326 QTC Calculation: 346 R Axis:   82 Text Interpretation:  Normal sinus rhythm Normal ECG Confirmed by Lacinda Axon   MD, Charliegh Vasudevan ([id number]) on 01/09/2016 4:20:55 PM      MDM   Final diagnoses:  Chest pain, unspecified chest pain type  Facial twitching    Patient is nontoxic-appearing. She is alert without neurological deficits. Vital signs are normal. Screening labs, EKG, chest x-ray, CT head negative.    Nat Christen, MD 01/09/16 2018

## 2016-01-09 NOTE — Discharge Instructions (Signed)
CT scan of head was normal. Tests showed no life-threatening condition.

## 2016-01-09 NOTE — ED Notes (Addendum)
Pt comes in for intermittent chest pain for 2 months. Pt states when she has these episodes she feels like her face is "drawing up". She states the CP is intermittent and comes on when she is stressed. Pt states there have been layoffs at her work and that when she gets anxious about this her chest hurts. Pt is having mild chest pain now but now problems with her face. NAD noted.   Pt states she can't hear out of her left ear x1 week.

## 2016-04-06 ENCOUNTER — Ambulatory Visit: Payer: Self-pay | Admitting: Obstetrics & Gynecology

## 2016-06-30 ENCOUNTER — Other Ambulatory Visit: Payer: Self-pay | Admitting: Obstetrics & Gynecology

## 2016-06-30 ENCOUNTER — Telehealth: Payer: Self-pay | Admitting: Obstetrics & Gynecology

## 2016-06-30 DIAGNOSIS — Z1231 Encounter for screening mammogram for malignant neoplasm of breast: Secondary | ICD-10-CM

## 2016-06-30 NOTE — Telephone Encounter (Signed)
Patient wants to speak with a nurse about some questions she has regarding her MS and possible menopause.

## 2016-06-30 NOTE — Telephone Encounter (Signed)
Left message to call Attie Nawabi at [phone]. 

## 2016-07-01 NOTE — Telephone Encounter (Signed)
Spoke with patient. Patient states that she has had MS symptoms her whole adult life, but has never been officially diagnosed with MS. Reports recently she has begun having increased hot flashes and severe night sweats that are interfering with her sleep. States she is very emotional and feels anxious and very sad. Denies SI/HI. "I feel like I am on an emotional roller coaster." Offered an appointment with Dr.Miller, but patient declines. 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 Asking for "natural" recommendations for treatment of menopausal symptoms. States she does not want to take rx because she is "very sensitive to medication and can't handle it." Asking for recommendations regarding anxiety as well. "I just want to feel better." Again advise OV is recommended. Patient again declines and would like me to discuss this with Dr.Miller and return call.

## 2016-07-01 NOTE — Telephone Encounter (Signed)
Patient returning your call.

## 2016-07-02 NOTE — Telephone Encounter (Signed)
Estroven or black cohosh is what I use for non-hormonal treatment of hot flashes.  She can take black cohosh twice daily.    St. John's wort is the only this herbal that can help with anxiety.    She will need to come in if need prescription treatment.

## 2016-07-02 NOTE — Telephone Encounter (Signed)
Spoke with patient. Advised of message as seen below from [place]. Patient is agreeable and verbalizes understanding. She will contact the office if she would like to speak with Dr.Miller about prescription options.  Routing to provider for final review. Patient agreeable to disposition. Will close encounter.

## 2016-07-02 NOTE — Telephone Encounter (Signed)
Patient called requesting to speak with [facility]. She said, "I tried some over the counter products from [place] last night and I am not feeling very well.  I'd like to talk to [facility] to give her an update so she can tell Dr. Sabra Heck."

## 2016-07-02 NOTE — Telephone Encounter (Signed)
Spoke with patient. Patient states that she went to the pharmacy last night and started [employer] and Melatonin. Reports she did not sleep well. Was up all night with night sweats. House this AM. "I know I need to give it time to get in my system, but I just wanted to let Dr.Miller know." Reports she has a lot of stress and anxiety that she feels are contributing to her symptoms. "I don't know if it is menopausal or anxiety or both." Again advised patient it will be best for her to come in to discuss concerns and symptoms with Dr.Miller. 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 Patient is requesting Dr.Miller's recommendations about what she can do for her symptoms. Advised I will review with Dr.Miller and retrun call.

## 2016-09-30 ENCOUNTER — Ambulatory Visit
Admission: RE | Admit: 2016-09-30 | Discharge: 2016-09-30 | Disposition: A | Discharge status: Home / Self Care | Payer: BLUE CROSS/BLUE SHIELD | Source: Ambulatory Visit | Attending: Obstetrics & Gynecology | Admitting: Obstetrics & Gynecology

## 2016-09-30 ENCOUNTER — Encounter: Payer: Self-pay | Admitting: Obstetrics & Gynecology

## 2016-09-30 ENCOUNTER — Ambulatory Visit (INDEPENDENT_AMBULATORY_CARE_PROVIDER_SITE_OTHER): Payer: BLUE CROSS/BLUE SHIELD | Admitting: Obstetrics & Gynecology

## 2016-09-30 VITALS — BP 110/70 | HR 72 | Resp 12 | Ht 63.25 in | Wt 132.4 lb

## 2016-09-30 DIAGNOSIS — Z0000 Encounter for general adult medical examination without abnormal findings: Secondary | ICD-10-CM | POA: Diagnosis not present

## 2016-09-30 DIAGNOSIS — R300 Dysuria: Secondary | ICD-10-CM | POA: Diagnosis not present

## 2016-09-30 DIAGNOSIS — Z23 Encounter for immunization: Secondary | ICD-10-CM

## 2016-09-30 DIAGNOSIS — Z01419 Encounter for gynecological examination (general) (routine) without abnormal findings: Secondary | ICD-10-CM | POA: Diagnosis not present

## 2016-09-30 DIAGNOSIS — Z1231 Encounter for screening mammogram for malignant neoplasm of breast: Secondary | ICD-10-CM | POA: Diagnosis not present

## 2016-09-30 LAB — COMPREHENSIVE METABOLIC PANEL
ALBUMIN: 4.3 g/dL (ref 3.6–5.1)
ALT: 16 U/L (ref 6–29)
AST: 18 U/L (ref 10–35)
Alkaline Phosphatase: 85 U/L (ref 33–130)
BUN: 13 mg/dL (ref 7–25)
CHLORIDE: 104 mmol/L (ref 98–110)
CO2: 25 mmol/L (ref 20–31)
Calcium: 9.4 mg/dL (ref 8.6–10.4)
Creat: 0.74 mg/dL (ref 0.50–1.05)
Glucose, Bld: 59 mg/dL — ABNORMAL LOW (ref 65–99)
POTASSIUM: 4.2 mmol/L (ref 3.5–5.3)
SODIUM: 139 mmol/L (ref 135–146)
TOTAL PROTEIN: 7.2 g/dL (ref 6.1–8.1)
Total Bilirubin: 0.5 mg/dL (ref 0.2–1.2)

## 2016-09-30 LAB — CBC WITH DIFFERENTIAL/PLATELET
BASOS ABS: 0 {cells}/uL (ref 0–200)
Basophils Relative: 0 %
EOS ABS: 68 {cells}/uL (ref 15–500)
Eosinophils Relative: 1 %
HEMATOCRIT: 40.7 % (ref 35.0–45.0)
HEMOGLOBIN: 13.2 g/dL (ref 11.7–15.5)
LYMPHS ABS: 1632 {cells}/uL (ref 850–3900)
LYMPHS PCT: 24 %
MCH: 29.4 pg (ref 27.0–33.0)
MCHC: 32.4 g/dL (ref 32.0–36.0)
MCV: 90.6 fL (ref 80.0–100.0)
MONO ABS: 612 {cells}/uL (ref 200–950)
MPV: 10.8 fL (ref 7.5–12.5)
Monocytes Relative: 9 %
NEUTROS PCT: 66 %
Neutro Abs: 4488 cells/uL (ref 1500–7800)
PLATELETS: 257 10*3/uL (ref 140–400)
RBC: 4.49 MIL/uL (ref 3.80–5.10)
RDW: 13.4 % (ref 11.0–15.0)
WBC: 6.8 10*3/uL (ref 3.8–10.8)

## 2016-09-30 LAB — LIPID PANEL
CHOL/HDL RATIO: 2.3 ratio (ref <5.0)
CHOLESTEROL: 171 mg/dL (ref <200)
HDL: 73 mg/dL (ref >50)
LDL CALC: 73 mg/dL (ref <100)
TRIGLYCERIDES: 125 mg/dL (ref <150)
VLDL: 25 mg/dL (ref <30)

## 2016-09-30 LAB — POCT URINALYSIS DIPSTICK
Bilirubin, UA: NEGATIVE
Blood, UA: NEGATIVE
Glucose, UA: NEGATIVE
Ketones, UA: NEGATIVE
Leukocytes, UA: NEGATIVE
Nitrite, UA: NEGATIVE
PROTEIN UA: NEGATIVE
Urobilinogen, UA: 0.2 E.U./dL
pH, UA: 5 (ref 5.0–8.0)

## 2016-09-30 LAB — IRON: IRON: 124 ug/dL (ref 45–160)

## 2016-09-30 LAB — TSH: TSH: 2.32 mIU/L

## 2016-09-30 NOTE — Addendum Note (Signed)
Addended by: Megan Salon on: 09/30/2016 11:13 AM   Modules accepted: Orders

## 2016-09-30 NOTE — Progress Notes (Addendum)
51 y.o. [id number] MarriedCaucasianF here for annual exam.  No vaginal bleeding.  Has occasional pelvic pain that is a shooting pain down in her pelvis.  This lasts for just a few seconds.    Reports she had an episode of hot flashes earlier this year.  Started the St. John's wort and black cohosh.  This really helped.    Overdue to see dermatologist.  Franco Nones this is due.  She states she will schedule this.    Is not working with her husband.  Has been doing this for six months.  Felt a lot of anxiety due to stressors of this new job.  Feeling that she is getting a handle of the job.  This has helped most of all.  Pt requested Tdap update today and was given this by nursing staff prior to being seen.  She desired this because having a new grandchild in August.  Pt advised she's already been given a Tdap and it is up to date.  Advised to monitor arm for reaction and use tylenol/motrin as needed.   Patient's last menstrual period was 01/27/2015.          Sexually active: Yes.    The current method of family planning is Hysterectomy  Exercising: No.  The patient does not participate in regular exercise at present. Smoker:  no  Health Maintenance: Pap:  11/28/14 WNL neg HR HPV; 07/27/13 Normal History of abnormal Pap:  Yes, had cryo in early 20's.  MMG:  09/26/15 BIRADS2, Density C.  Has appt scheduled today. Colonoscopy: 06/02/15 Normal. Repeat 5 years BMD:  Never TDaP: 07/27/13 Screening Labs: done today, Hb today: done today, Urine today: neg   reports that she has never smoked. She has never used smokeless tobacco. She reports that she does not drink alcohol or use drugs.  Past Medical History:  Diagnosis Date  . Anemia   . Anxiety   . Arthritis   . Asthma    hx  . Blurred vision   . DDD (degenerative disc disease), cervical   . Fatigue   . Hernia   . History of hemorrhoids   . Hypertension    no medication  . Joint pain   . Melanoma ([place])    X3  . Melanoma ([place]) 2000-2005   x3  .  Migraines   . Neuromuscular disorder ([place])   . Paresthesias   . PONV (postoperative nausea and vomiting)     Past Surgical History:  Procedure Laterality Date  . ABDOMINAL HYSTERECTOMY    . AUGMENTATION MAMMAPLASTY Bilateral 2008  . BREAST BIOPSY Left 08/22/2012   Procedure: Needle localization removal left breast mass;  Surgeon: Haywood Lasso, MD;  Location: [facility];  Service: General;  Laterality: Left;  Needle localization removal left breast mass  . BREAST EXCISIONAL BIOPSY Left   . BREAST SURGERY     "uplift"  with saline implants  . BREAST SURGERY  2002   lt lump-negative  . CYSTOSCOPY N/A 02/17/2015   Procedure: CYSTOSCOPY;  Surgeon: Megan Salon, MD;  Location: [facility];  Service: Gynecology;  Laterality: N/A;  . HERNIA REPAIR    . LAPAROSCOPIC BILATERAL SALPINGECTOMY Bilateral 02/17/2015   Procedure: LAPAROSCOPIC BILATERAL SALPINGECTOMY;  Surgeon: Megan Salon, MD;  Location: [facility];  Service: Gynecology;  Laterality: Bilateral;  . LAPAROSCOPIC HYSTERECTOMY N/A 02/17/2015   Procedure: HYSTERECTOMY TOTAL LAPAROSCOPIC;  Surgeon: Megan Salon, MD;  Location: [facility];  Service: Gynecology;  Laterality: N/A;  .  MELANOMA EXCISION     X3  . TUBAL LIGATION  in 20's   And reverse tubal ligation  . Tubal Reversal      Current Outpatient Prescriptions  Medication Sig Dispense Refill  . MELATONIN PO Take by mouth.     No current facility-administered medications for this visit.     Family History  Problem Relation Age of Onset  . Colon cancer Mother 12  . Colon cancer Father 101  . Cancer Paternal Aunt 54    Breast  . Breast cancer Paternal Aunt     ROS:  Pertinent items are noted in HPI.  Otherwise, a comprehensive ROS was negative.  Exam:   BP 110/70 (BP Location: Right Arm, Patient Position: Sitting, Cuff Size: Normal)   Pulse 72   Resp 12   Ht 5' 3.25" (1.607 m)   Wt 132 lb 6.4 oz (60.1 kg)   LMP 01/27/2015   BMI 23.27 kg/m   Weight change:  +20#  Height: 5' 3.25" (160.7 cm)  Ht Readings from Last 3 Encounters:  09/30/16 5' 3.25" (1.607 m)  01/09/16 5\' 3"  (1.6 m)  02/17/15 5\' 3"  (1.6 m)    General appearance: alert, cooperative and appears stated age Head: Normocephalic, without obvious abnormality, atraumatic Neck: no adenopathy, supple, symmetrical, trachea midline and thyroid normal to inspection and palpation Lungs: clear to auscultation bilaterally Breasts: normal appearance, no masses or tenderness Heart: regular rate and rhythm Abdomen: soft, non-tender; bowel sounds normal; no masses,  no organomegaly Extremities: extremities normal, atraumatic, no cyanosis or edema Skin: Skin color, texture, turgor normal. No rashes or lesions Lymph nodes: Cervical, supraclavicular, and axillary nodes normal. No abnormal inguinal nodes palpated Neurologic: Grossly normal   Pelvic: External genitalia:  no lesions              Urethra:  normal appearing urethra with no masses, tenderness or lesions              Bartholins and Skenes: normal                 Vagina: normal appearing vagina with normal color and discharge, no lesions              Cervix: absent              Pap taken: No. Bimanual Exam:  Uterus:  uterus absent              Adnexa: normal adnexa and no mass, fullness, tenderness               Rectovaginal: Confirms               Anus:  normal sphincter tone, no lesions  Chaperone was present for exam.  A:  Well Woman with normal exam H/O melanoma.  Over due for follow-up. H/O TLH/bilateral salpingectomy 8/16 H/O possible abnormality on CXR 01/02/13. CT follow up did not show this. Anxiety   P:   Mammogram guidelines reviewed.  Pt has scheduled today. pap smear not indicated Iron studies, CBC with diff, CMP, TSH, Vit D, and lipids today tdap update.  Pt advised to monitor for reactions.  Instructions given to call if significant.  OTC options for mild or moderate symptoms given. Return annually or prn

## 2016-10-01 LAB — FERRITIN: FERRITIN: 28 ng/mL (ref 10–232)

## 2016-10-01 LAB — VITAMIN D 25 HYDROXY (VIT D DEFICIENCY, FRACTURES): Vit D, 25-Hydroxy: 21 ng/mL — ABNORMAL LOW (ref 30–100)

## 2016-10-04 ENCOUNTER — Telehealth: Payer: Self-pay

## 2016-10-04 DIAGNOSIS — E559 Vitamin D deficiency, unspecified: Secondary | ICD-10-CM

## 2016-10-04 NOTE — Telephone Encounter (Signed)
-----   Message from Megan Salon, MD sent at 10/03/2016  3:14 PM EDT ----- Please let pt know her lab work was good--CBC with diff, lipid panel, thyroid, CMP, iron and ferritin levels were all normal.  Her Vit D is low.  She can either take 50K weekly for 12 weeks or take 2000 IU daily.  If does the prescription, should recheck levels in 12 weeks.  No orders have been placed.

## 2016-10-04 NOTE — Telephone Encounter (Signed)
Spoke with patient. Advised of message as seen below from [place]. Patient verbalizes understanding and would like to start on Vitamin D 2000 IU daily. 12 week recheck scheduled for 01/03/2017 at 3:30 pm. Future order for vitamin D placed. Patient requests lab results from 09/30/2016 be sent to [address] [place], [place] [address]. This is a new address for the patient as she is in between moving. This is updated in her chart. Patient is agreeable. Verbal request for release of medical records completed and to [facility].  Cc: [place] to provider for final review. Patient agreeable to disposition. Will close encounter.

## 2016-12-29 ENCOUNTER — Telehealth: Payer: Self-pay | Admitting: Obstetrics & Gynecology

## 2016-12-29 NOTE — Telephone Encounter (Signed)
Patient called and cancelled her appointment for vitamin D on 01/03/17. She said she does not wish to reschedule this appointment. Routing to provider for FYI.

## 2016-12-29 NOTE — Telephone Encounter (Signed)
Thanks for the update.  Appt has been cancelled.  Encounter can be closed.

## 2017-01-03 ENCOUNTER — Other Ambulatory Visit: Payer: BLUE CROSS/BLUE SHIELD

## 2017-05-04 DIAGNOSIS — D1800 Hemangioma unspecified site: Secondary | ICD-10-CM | POA: Diagnosis not present

## 2017-05-04 DIAGNOSIS — D235 Other benign neoplasm of skin of trunk: Secondary | ICD-10-CM | POA: Diagnosis not present

## 2017-05-04 DIAGNOSIS — L570 Actinic keratosis: Secondary | ICD-10-CM | POA: Diagnosis not present

## 2017-11-02 ENCOUNTER — Other Ambulatory Visit: Payer: Self-pay | Admitting: Obstetrics & Gynecology

## 2017-11-02 DIAGNOSIS — Z1231 Encounter for screening mammogram for malignant neoplasm of breast: Secondary | ICD-10-CM

## 2017-11-24 ENCOUNTER — Ambulatory Visit: Payer: BLUE CROSS/BLUE SHIELD

## 2017-11-25 ENCOUNTER — Ambulatory Visit
Admission: RE | Admit: 2017-11-25 | Discharge: 2017-11-25 | Disposition: A | Discharge status: Home / Self Care | Payer: BLUE CROSS/BLUE SHIELD | Source: Ambulatory Visit | Attending: Obstetrics & Gynecology | Admitting: Obstetrics & Gynecology

## 2017-11-25 DIAGNOSIS — Z1231 Encounter for screening mammogram for malignant neoplasm of breast: Secondary | ICD-10-CM

## 2017-11-29 ENCOUNTER — Other Ambulatory Visit: Payer: Self-pay | Admitting: Obstetrics & Gynecology

## 2017-11-29 DIAGNOSIS — R928 Other abnormal and inconclusive findings on diagnostic imaging of breast: Secondary | ICD-10-CM

## 2017-12-01 ENCOUNTER — Other Ambulatory Visit: Payer: Self-pay | Admitting: Obstetrics & Gynecology

## 2017-12-01 ENCOUNTER — Ambulatory Visit
Admission: RE | Admit: 2017-12-01 | Discharge: 2017-12-01 | Disposition: A | Discharge status: Home / Self Care | Payer: BLUE CROSS/BLUE SHIELD | Source: Ambulatory Visit | Attending: Obstetrics & Gynecology | Admitting: Obstetrics & Gynecology

## 2017-12-01 DIAGNOSIS — R922 Inconclusive mammogram: Secondary | ICD-10-CM | POA: Diagnosis not present

## 2017-12-01 DIAGNOSIS — R928 Other abnormal and inconclusive findings on diagnostic imaging of breast: Secondary | ICD-10-CM

## 2017-12-01 DIAGNOSIS — N6320 Unspecified lump in the left breast, unspecified quadrant: Secondary | ICD-10-CM

## 2017-12-01 DIAGNOSIS — N6310 Unspecified lump in the right breast, unspecified quadrant: Secondary | ICD-10-CM | POA: Diagnosis not present

## 2017-12-27 ENCOUNTER — Encounter: Payer: Self-pay | Admitting: Obstetrics & Gynecology

## 2017-12-27 ENCOUNTER — Other Ambulatory Visit: Payer: Self-pay

## 2017-12-27 ENCOUNTER — Ambulatory Visit: Payer: BLUE CROSS/BLUE SHIELD | Admitting: Obstetrics & Gynecology

## 2017-12-27 VITALS — BP 110/80 | HR 64 | Resp 16 | Ht 63.0 in | Wt 131.4 lb

## 2017-12-27 DIAGNOSIS — Z01419 Encounter for gynecological examination (general) (routine) without abnormal findings: Secondary | ICD-10-CM | POA: Diagnosis not present

## 2017-12-27 DIAGNOSIS — E559 Vitamin D deficiency, unspecified: Secondary | ICD-10-CM

## 2017-12-27 DIAGNOSIS — M2550 Pain in unspecified joint: Secondary | ICD-10-CM

## 2017-12-27 DIAGNOSIS — Z0000 Encounter for general adult medical examination without abnormal findings: Secondary | ICD-10-CM | POA: Diagnosis not present

## 2017-12-27 NOTE — Progress Notes (Signed)
52 y.o. [id number] MarriedCaucasianF here for annual exam.  Doing well.  Father in law died earlier this year.  He was 33 yo.  This has been hard.  Denies vaginal bleeding.  Night sweats are improved this year.  Feels sleep is ok.  Has noted some thickening along the left breast that is above the implant.  This looks different to her and she did point this out when she went for a mammogram 6/19.  Ultrasound was done at the same time.    Having a lot of joint/muscular pain.  Blood work discussed to see if this may be auto-immune related.  Pt comfortable with proceeding with testing today.  Patient's last menstrual period was 01/27/2015.          Sexually active: Yes.    The current method of family planning is status post hysterectomy.    Exercising: No.   Smoker:  no  Health Maintenance: Pap:  11/28/14 neg. HR HPV:neg   07/27/13 Neg  History of abnormal Pap:  yes MMG:  12/01/17 [place] Left BIRADS2:Benign. F/u 1 year  Colonoscopy:  06/02/15 Normal. F/u 5 years  BMD:   Never TDaP: 09/2016 Pneumonia vaccine(s):  n/a Shingrix: D/w pt shingrix vaccination today Hep C testing: n/a Screening Labs: Here today - Vit D   reports that she has never smoked. She has never used smokeless tobacco. She reports that she does not drink alcohol or use drugs.  Past Medical History:  Diagnosis Date  . Anemia   . Anxiety   . Arthritis   . Asthma    hx  . Blurred vision   . DDD (degenerative disc disease), cervical   . Fatigue   . Hernia   . History of hemorrhoids   . Hypertension    no medication  . Joint pain   . Melanoma ([place])    X3  . Melanoma ([place]) 2000-2005   x3  . Migraines   . Neuromuscular disorder ([place])   . Paresthesias   . PONV (postoperative nausea and vomiting)     Past Surgical History:  Procedure Laterality Date  . ABDOMINAL HYSTERECTOMY    . AUGMENTATION MAMMAPLASTY Bilateral 2008  . BREAST BIOPSY Left 08/22/2012   Procedure: Needle localization removal left breast mass;   Surgeon: Haywood Lasso, MD;  Location: [facility];  Service: General;  Laterality: Left;  Needle localization removal left breast mass  . BREAST EXCISIONAL BIOPSY Left    benign  . BREAST SURGERY     "uplift"  with saline implants  . BREAST SURGERY  2002   lt lump-negative  . CYSTOSCOPY N/A 02/17/2015   Procedure: CYSTOSCOPY;  Surgeon: Megan Salon, MD;  Location: [facility];  Service: Gynecology;  Laterality: N/A;  . HERNIA REPAIR    . LAPAROSCOPIC BILATERAL SALPINGECTOMY Bilateral 02/17/2015   Procedure: LAPAROSCOPIC BILATERAL SALPINGECTOMY;  Surgeon: Megan Salon, MD;  Location: [facility];  Service: Gynecology;  Laterality: Bilateral;  . LAPAROSCOPIC HYSTERECTOMY N/A 02/17/2015   Procedure: HYSTERECTOMY TOTAL LAPAROSCOPIC;  Surgeon: Megan Salon, MD;  Location: [facility];  Service: Gynecology;  Laterality: N/A;  . MELANOMA EXCISION     X3  . TUBAL LIGATION  in 20's   And reverse tubal ligation  . Tubal Reversal      Current Outpatient Medications  Medication Sig Dispense Refill  . cholecalciferol (VITAMIN D) 1000 units tablet Take 1,000 Units by mouth 2 (two) times daily.    . diphenhydrAMINE (BENADRYL) 25 mg capsule  Take 25 mg by mouth at bedtime as needed.    . Nutritional Supplements (ESTROVEN NIGHTTIME) TABS Take by mouth daily.    Francella Solian Johns Wort 300 MG TABS Take by mouth daily.    Marland Kitchen VALERIAN ROOT PO Take by mouth daily.     No current facility-administered medications for this visit.     Family History  Problem Relation Age of Onset  . Colon cancer Mother 41  . Colon cancer Father 49  . Cancer Paternal Aunt 35       Breast  . Breast cancer Paternal Aunt     Review of Systems  Cardiovascular: Positive for palpitations.  Gastrointestinal:       Change in quality of stools   Genitourinary: Positive for frequency.       Loss of sexual interest   Musculoskeletal: Positive for joint pain and myalgias.       Muscle weakness  Neurological:       Difficulty  with memory or speech   Endo/Heme/Allergies:       Heat intolerance   Psychiatric/Behavioral: Positive for depression. The patient is nervous/anxious.   All other systems reviewed and are negative.   Exam:   BP 110/80 (BP Location: Right Arm, Patient Position: Sitting, Cuff Size: Normal)   Pulse 64   Resp 16   Ht '5\' 3"'  (1.6 m)   Wt 131 lb 6.4 oz (59.6 kg)   LMP 01/27/2015   BMI 23.28 kg/m     Height: '5\' 3"'  (160 cm)  Ht Readings from Last 3 Encounters:  12/27/17 '5\' 3"'  (1.6 m)  09/30/16 5' 3.25" (1.607 m)  01/09/16 '5\' 3"'  (1.6 m)    General appearance: alert, cooperative and appears stated age Head: Normocephalic, without obvious abnormality, atraumatic Neck: no adenopathy, supple, symmetrical, trachea midline and thyroid normal to inspection and palpation Lungs: clear to auscultation bilaterally Breasts: normal appearance, no masses or tenderness, right breast implant is lower on chest wall and I think this is the reason her left breast has a fuller appearance above the breast Heart: regular rate and rhythm Abdomen: soft, non-tender; bowel sounds normal; no masses,  no organomegaly Extremities: extremities normal, atraumatic, no cyanosis or edema Skin: Skin color, texture, turgor normal. No rashes or lesions Lymph nodes: Cervical, supraclavicular, and axillary nodes normal. No abnormal inguinal nodes palpated Neurologic: Grossly normal   Pelvic: External genitalia:  no lesions              Urethra:  normal appearing urethra with no masses, tenderness or lesions              Bartholins and Skenes: normal                 Vagina: normal appearing vagina with normal color and discharge, no lesions              Cervix: absent              Pap taken: No. Bimanual Exam:  Uterus:  uterus absent              Adnexa: no mass, fullness, tenderness               Rectovaginal: Confirms               Anus:  normal sphincter tone, no lesions  Chaperone was present for exam.  A:  Well  Woman with normal exam H/O melanoma, followed by dermatology TLH/bilateral salpingectomy 8/16 Chronic joint pain/stiffness  Multiple neurologic symptoms--has seen three different neurologists in the past with no definitive diagnosis Anxiety  P:   Mammogram guidelines reviewed pap smear not indicated RF, ANA, CRP, ESR obtained today Vit D obtained today Shingrix vaccination discussed Return annually or prn

## 2017-12-28 ENCOUNTER — Telehealth: Payer: Self-pay | Admitting: Obstetrics & Gynecology

## 2017-12-28 LAB — VITAMIN D 25 HYDROXY (VIT D DEFICIENCY, FRACTURES): VIT D 25 HYDROXY: 37 ng/mL (ref 30.0–100.0)

## 2017-12-28 LAB — ANA: Anti Nuclear Antibody(ANA): NEGATIVE

## 2017-12-28 LAB — RHEUMATOID FACTOR

## 2017-12-28 LAB — TSH: TSH: 0.548 u[IU]/mL (ref 0.450–4.500)

## 2017-12-28 LAB — SEDIMENTATION RATE: SED RATE: 2 mm/h (ref 0–40)

## 2017-12-28 NOTE — Telephone Encounter (Signed)
Spoke with patient. Request to update medication list to include OTC tylenol allergy sinus. Takes daily to prevent ear pain and sinus pressure. Has not discussed ear pain with PCP, medication helps.   Denies any GYN concerns.  Med list updated.   Routing to provider for final review. Patient is agreeable to disposition. Will close encounter.

## 2017-12-28 NOTE — Telephone Encounter (Signed)
Patient calling to add to her medications list. States she takes an over the counter sinus/allergy medication every day to avoid ear pain.

## 2018-01-05 ENCOUNTER — Telehealth: Payer: Self-pay | Admitting: Obstetrics & Gynecology

## 2018-01-05 NOTE — Telephone Encounter (Signed)
Spoke with patient, advised as seen below per Dr. Sabra Heck. Patient states no specific need, will wait until next AEX. Thankful for return call.   Routing to provider for final review. Patient is agreeable to disposition. Will close encounter.

## 2018-01-05 NOTE — Telephone Encounter (Signed)
Spoke with patient. AEX on 12/27/17, patient inquiring about additional yearly screening  labs not drawn: CBC, CMP, Lipid panel.  Advised she can return to office for lab appointment to have these drawn if she desires. Patient will let me know when I return call if she will plan to return to office or a [facility] near her. Advised I will review with Dr. Sabra Heck and return call.   Dr. Sabra Heck -please review labs dated 12/27/17, ok to proceed with additional labs?

## 2018-01-05 NOTE — Telephone Encounter (Signed)
Left message to call Kayla Harris at [phone].  

## 2018-01-05 NOTE — Telephone Encounter (Signed)
She had all of her routine lab work done last year in 2018 and it is not needed yearly.  So, unless there is a specific need for this (like insurance), I would wait another year before repeating.  Thanks.

## 2018-01-05 NOTE — Telephone Encounter (Signed)
Patient has questions about her lab results from [facility] 12/27/17. Please let patient know her lab results were mailed to her yesterday. Thank you.

## 2018-04-25 DIAGNOSIS — R1310 Dysphagia, unspecified: Secondary | ICD-10-CM | POA: Diagnosis not present

## 2018-04-25 DIAGNOSIS — Z1211 Encounter for screening for malignant neoplasm of colon: Secondary | ICD-10-CM | POA: Diagnosis not present

## 2018-04-25 DIAGNOSIS — Z800 Family history of malignant neoplasm of digestive organs: Secondary | ICD-10-CM | POA: Diagnosis not present

## 2018-04-25 DIAGNOSIS — K219 Gastro-esophageal reflux disease without esophagitis: Secondary | ICD-10-CM | POA: Diagnosis not present

## 2018-05-02 DIAGNOSIS — L570 Actinic keratosis: Secondary | ICD-10-CM | POA: Diagnosis not present

## 2018-06-20 DIAGNOSIS — R1310 Dysphagia, unspecified: Secondary | ICD-10-CM | POA: Diagnosis not present

## 2018-06-20 DIAGNOSIS — Z1211 Encounter for screening for malignant neoplasm of colon: Secondary | ICD-10-CM | POA: Diagnosis not present

## 2018-06-20 DIAGNOSIS — K219 Gastro-esophageal reflux disease without esophagitis: Secondary | ICD-10-CM | POA: Diagnosis not present

## 2018-06-20 DIAGNOSIS — Z800 Family history of malignant neoplasm of digestive organs: Secondary | ICD-10-CM | POA: Diagnosis not present

## 2018-08-24 ENCOUNTER — Telehealth: Payer: Self-pay | Admitting: Obstetrics & Gynecology

## 2018-08-24 ENCOUNTER — Other Ambulatory Visit: Payer: Self-pay | Admitting: Obstetrics & Gynecology

## 2018-08-24 DIAGNOSIS — Z1231 Encounter for screening mammogram for malignant neoplasm of breast: Secondary | ICD-10-CM

## 2018-08-24 NOTE — Telephone Encounter (Signed)
Spoke with patient. S/p TLH with BS.  She complaints of one year of intermittent lower abdominal pain. Sometimes pain radiates from Left to Right with pain more often on the R side.  She states she has had a normal GI workup by Dr. Collene Mares.  She states she has GI issues and food "goes through me" but this has been ongoing for one year as well.  No current fevers or vaginal bleeding.   She feels these issues have been ongoing and not acute. Wonders if her ovary is a problem. No acute pain at this time. Sometimes uses a medicine "for colon spasms" prescribed by Dr. Collene Mares.  Office visit with Dr. Sabra Heck on Monday 08/28/2018. She is advised to call back or go to closest ED if develops any increased pain, nausea, vomiting or any concerns.  Routing to Dr. Sabra Heck.  Okay to close?

## 2018-08-24 NOTE — Telephone Encounter (Signed)
Patient called and requested a call back from the nurse. She said she has been experiencing abdominal pain that radiates from one side to the other but is mostly on the right.   Last seen: 12/27/17

## 2018-08-25 NOTE — Telephone Encounter (Signed)
Yes.  Ok to close encounter.

## 2018-08-28 ENCOUNTER — Encounter: Payer: Self-pay | Admitting: Obstetrics & Gynecology

## 2018-08-28 ENCOUNTER — Ambulatory Visit: Payer: Self-pay | Admitting: Obstetrics & Gynecology

## 2018-08-28 ENCOUNTER — Other Ambulatory Visit: Payer: Self-pay

## 2018-08-28 ENCOUNTER — Telehealth: Payer: Self-pay | Admitting: *Deleted

## 2018-08-28 ENCOUNTER — Ambulatory Visit (INDEPENDENT_AMBULATORY_CARE_PROVIDER_SITE_OTHER): Payer: BLUE CROSS/BLUE SHIELD | Admitting: Obstetrics & Gynecology

## 2018-08-28 VITALS — BP 132/72 | HR 80 | Temp 98.0°F | Resp 16 | Ht 63.0 in | Wt 130.8 lb

## 2018-08-28 DIAGNOSIS — R195 Other fecal abnormalities: Secondary | ICD-10-CM

## 2018-08-28 DIAGNOSIS — R1011 Right upper quadrant pain: Secondary | ICD-10-CM

## 2018-08-28 DIAGNOSIS — R1031 Right lower quadrant pain: Secondary | ICD-10-CM

## 2018-08-28 DIAGNOSIS — R3915 Urgency of urination: Secondary | ICD-10-CM | POA: Diagnosis not present

## 2018-08-28 DIAGNOSIS — R634 Abnormal weight loss: Secondary | ICD-10-CM

## 2018-08-28 LAB — POCT URINALYSIS DIPSTICK
Bilirubin, UA: NEGATIVE
Blood, UA: NEGATIVE
Glucose, UA: NEGATIVE
Ketones, UA: NEGATIVE
LEUKOCYTES UA: NEGATIVE
Nitrite, UA: NEGATIVE
PH UA: 7 (ref 5.0–8.0)
Protein, UA: NEGATIVE
Urobilinogen, UA: 0.2 E.U./dL

## 2018-08-28 MED ORDER — CYCLOBENZAPRINE HCL 5 MG PO TABS: 5.0000 mg | ORAL_TABLET | Freq: Three times a day (TID) | ORAL | 0 refills | Status: DC | PRN

## 2018-08-28 NOTE — Telephone Encounter (Signed)
Patient would like to know if she can still be seen today for abdominal pain. Was offered early appointment but could not come in. She is available rest of the day.

## 2018-08-28 NOTE — Telephone Encounter (Signed)
Spoke with patient. Requesting OV for today with Dr. Sabra Heck for abdominal pain, radiating from left to right, more often on the right side. Intermittent for the last year. S/p TLH with BS. See telephone encounter dated 08/24/18, denies any new or worsening symptoms. OV scheduled for today at 4pm with Dr. Sabra Heck. Patient verbalizes understanding and is agreeable.   Encounter closed.

## 2018-08-28 NOTE — Progress Notes (Signed)
GYNECOLOGY  VISIT  CC:  Right side abdominal pain  HPI: 53 y.o. G78P2002 Married White or Caucasian female here complaint of bowel changes that started about a year ago and are primary loose stool.  It is not frank watery diarrhea but can be watery at times.  The volume seems much larger to her.  She's noticed some green color to the stool at times.  This has been associated with RLQ pain that has gradually worsened in the last year as well.  Pain was initially right abdomen but is not extending up towards her ribs and down to the pelvis.  She also has some radiating back pain around to her back.  Loose stool occurs  almost every time she eat.  Some foods seem to cause symptoms to be worse.  Hasn't really been able to figure out if anything materially makes this better.    Has tried stopping all vitamins to see if this helped.  It didn't.    Reports there is some associated RLQ pressure that seems present as well.  She has tried drinking wine at night to help with bowel symptoms and to help with back pain.  This did not help.    Had colonoscopy and endoscopy in 2019 with Dr. Collene Mares.  Both were negative.  Did not have any other blood work or imaging studies done.  Has not has any stool tests.  Does feel there is some bladder pressure as well as painful intercourse that feels present at the top of the vagina.  This occurs only with intercourse.  Denies vaginal bleeding or discharge.  Denies urinary urgency, frequency, or hematuria.  Has also tried hyoscyamine this last week without any improvement.  She cannot take pain medications so back is quite bothersome today.    Feels a lot of urgency with bowel movements.  Has soiled pains several times this past year.  Reports she's lost weight and got down to #115.  Now is back around #130 because she is "eating anything that will stick" including lots of sweets.    GYNECOLOGIC HISTORY: Patient's last menstrual period was 01/27/2015. Contraception:  hysterectomy  Menopausal hormone therapy: none  Patient Active Problem List   Diagnosis Date Noted  . Endometriosis 02/17/2015    Past Medical History:  Diagnosis Date  . Anemia   . Anxiety   . Arthritis   . Asthma    hx  . Blurred vision   . DDD (degenerative disc disease), cervical   . Fatigue   . Hernia   . History of hemorrhoids   . Hypertension    no medication  . Joint pain   . Melanoma ([place])    X3  . Melanoma ([place]) 2000-2005   x3  . Migraines   . Neuromuscular disorder ([place])   . Paresthesias   . PONV (postoperative nausea and vomiting)     Past Surgical History:  Procedure Laterality Date  . ABDOMINAL HYSTERECTOMY    . AUGMENTATION MAMMAPLASTY Bilateral 2008  . BREAST BIOPSY Left 08/22/2012   Procedure: Needle localization removal left breast mass;  Surgeon: Haywood Lasso, MD;  Location: [facility];  Service: General;  Laterality: Left;  Needle localization removal left breast mass  . BREAST EXCISIONAL BIOPSY Left    benign  . BREAST SURGERY     "uplift"  with saline implants  . BREAST SURGERY  2002   lt lump-negative  . CYSTOSCOPY N/A 02/17/2015   Procedure: CYSTOSCOPY;  Surgeon: Lemmie Evens  Sabra Heck, MD;  Location: [facility];  Service: Gynecology;  Laterality: N/A;  . HERNIA REPAIR    . LAPAROSCOPIC BILATERAL SALPINGECTOMY Bilateral 02/17/2015   Procedure: LAPAROSCOPIC BILATERAL SALPINGECTOMY;  Surgeon: Megan Salon, MD;  Location: [facility];  Service: Gynecology;  Laterality: Bilateral;  . LAPAROSCOPIC HYSTERECTOMY N/A 02/17/2015   Procedure: HYSTERECTOMY TOTAL LAPAROSCOPIC;  Surgeon: Megan Salon, MD;  Location: [facility];  Service: Gynecology;  Laterality: N/A;  . MELANOMA EXCISION     X3  . TUBAL LIGATION  in 20's   And reverse tubal ligation  . Tubal Reversal      MEDS:   Current Outpatient Medications on File Prior to Visit  Medication Sig Dispense Refill  . hyoscyamine (ANASPAZ) 0.125 MG TBDP disintergrating tablet Place 0.125 mg under  the tongue every 6 (six) hours as needed.    . Chlorphen-Pseudoephed-APAP (TYLENOL ALLERGY SINUS PO) Take by mouth daily. Patient reported, takes daily OTC    . cholecalciferol (VITAMIN D) 1000 units tablet Take 1,000 Units by mouth 2 (two) times daily.    . diphenhydrAMINE (BENADRYL) 25 mg capsule Take 25 mg by mouth at bedtime as needed.    . Nutritional Supplements (ESTROVEN NIGHTTIME) TABS Take by mouth daily.    Francella Solian Johns Wort 300 MG TABS Take by mouth daily.    Marland Kitchen VALERIAN ROOT PO Take by mouth daily.     No current facility-administered medications on file prior to visit.     ALLERGIES: Dilaudid [hydromorphone hcl]; Codeine; Flexeril [cyclobenzaprine]; Imitrex [sumatriptan]; Other; Penicillins; and Ultram [tramadol]  Family History  Problem Relation Age of Onset  . Colon cancer Mother 68  . Colon cancer Father 64  . Cancer Paternal Aunt 23       Breast  . Breast cancer Paternal Aunt     SH:  Married, non smoker  Review of Systems  Constitutional:       Weight loss   Gastrointestinal: Positive for abdominal distention, abdominal pain, diarrhea, nausea and vomiting.  Endocrine: Positive for polydipsia.       Craving sweets  Craving ice   Genitourinary: Positive for dyspareunia, frequency and urgency.       Loss of sexual interest  Vaginal lumps   Musculoskeletal: Positive for myalgias.       Muscle weakness   All other systems reviewed and are negative.   PHYSICAL EXAMINATION:    BP 132/72 (BP Location: Left Arm, Patient Position: Sitting, Cuff Size: Normal)   Pulse 80   Temp 98 F (36.7 C) (Oral)   Resp 16   Ht 5\' 3"  (1.6 m)   Wt 130 lb 12.8 oz (59.3 kg)   LMP 01/27/2015   BMI 23.17 kg/m     Physical Exam  Constitutional: She is oriented to person, place, and time. She appears well-developed and well-nourished.  Cardiovascular: Normal rate and regular rhythm.  Respiratory: Effort normal and breath sounds normal.  GI: Soft. Bowel sounds are normal. She  exhibits distension. She exhibits no mass. There is no hepatosplenomegaly. There is abdominal tenderness (right abdominal pain). There is no rebound and no guarding.  Genitourinary:    Vagina and uterus normal.  There is no rash, tenderness, lesion or injury on the right labia. There is no rash, tenderness, lesion or injury on the left labia.    Genitourinary Comments: Uterus and cervix were surgically absent.   Lymphadenopathy:       Right: No inguinal adenopathy present.       Left:  No inguinal adenopathy present.  Neurological: She is alert and oriented to person, place, and time.  Skin: Skin is warm and dry.  Psychiatric: She has a normal mood and affect.   Chaperone was present for exam.  Assessment: Loose stool that that has been present for about a year Right sided abdominal pain, bloating Right low back pain Weight loss Vaginal pain that may be menopausal related  Plan: CBC, CMP, amylase, lipase, TSH, transglutaminase Stool culture, O&P, C diff CT abdomen/pelvis will be planned   ~25 minutes spent with patient >50% of time was in face to face discussion of above.

## 2018-08-29 ENCOUNTER — Encounter: Payer: Self-pay | Admitting: Obstetrics & Gynecology

## 2018-08-29 LAB — COMPREHENSIVE METABOLIC PANEL
A/G RATIO: 1.9 (ref 1.2–2.2)
ALBUMIN: 4.5 g/dL (ref 3.8–4.9)
ALK PHOS: 109 IU/L (ref 39–117)
ALT: 23 IU/L (ref 0–32)
AST: 22 IU/L (ref 0–40)
BUN / CREAT RATIO: 13 (ref 9–23)
BUN: 10 mg/dL (ref 6–24)
Bilirubin Total: 0.3 mg/dL (ref 0.0–1.2)
CO2: 27 mmol/L (ref 20–29)
CREATININE: 0.8 mg/dL (ref 0.57–1.00)
Calcium: 9.2 mg/dL (ref 8.7–10.2)
Chloride: 105 mmol/L (ref 96–106)
GFR calc Af Amer: 98 mL/min/{1.73_m2} (ref >59)
GFR calc non Af Amer: 85 mL/min/{1.73_m2} (ref >59)
Globulin, Total: 2.4 g/dL (ref 1.5–4.5)
Glucose: 89 mg/dL (ref 65–99)
POTASSIUM: 3.9 mmol/L (ref 3.5–5.2)
Sodium: 145 mmol/L — ABNORMAL HIGH (ref 134–144)
Total Protein: 6.9 g/dL (ref 6.0–8.5)

## 2018-08-29 LAB — CBC
HEMATOCRIT: 36.3 % (ref 34.0–46.6)
HEMOGLOBIN: 12.2 g/dL (ref 11.1–15.9)
MCH: 30.2 pg (ref 26.6–33.0)
MCHC: 33.6 g/dL (ref 31.5–35.7)
MCV: 90 fL (ref 79–97)
Platelets: 275 10*3/uL (ref 150–450)
RBC: 4.04 x10E6/uL (ref 3.77–5.28)
RDW: 12.6 % (ref 11.7–15.4)
WBC: 6.9 10*3/uL (ref 3.4–10.8)

## 2018-08-29 LAB — TSH: TSH: 0.763 u[IU]/mL (ref 0.450–4.500)

## 2018-08-29 LAB — HEPATIC FUNCTION PANEL: Bilirubin, Direct: 0.08 mg/dL (ref 0.00–0.40)

## 2018-08-29 LAB — LIPASE: LIPASE: 35 U/L (ref 14–72)

## 2018-08-29 LAB — AMYLASE: AMYLASE: 53 U/L (ref 31–110)

## 2018-08-29 LAB — TISSUE TRANSGLUTAMINASE, IGA: Transglutaminase IgA: <2 U/mL (ref 0–3)

## 2018-08-29 NOTE — Addendum Note (Signed)
Addended by: Michele Mcalpine on: 08/29/2018 08:29 AM   Modules accepted: Orders

## 2018-08-30 ENCOUNTER — Telehealth: Payer: Self-pay | Admitting: Obstetrics & Gynecology

## 2018-08-30 LAB — URINALYSIS, MICROSCOPIC ONLY

## 2018-08-30 LAB — URINE CULTURE

## 2018-08-30 NOTE — Telephone Encounter (Signed)
Called and left detailed message.  Okay per designated party release form.  Advised initially called to schedule CT Scan but it was not a morning appointment per her preference so it was cancelled.  [employer] has declined initial request for [employer].  Dr. Sabra Heck will need to call and speak with another Dr. For authorization.  Advised will call back when I have approval and when CT scan is scheduled.

## 2018-08-30 NOTE — Telephone Encounter (Signed)
Patient sent the following message through [facility]. Routing to triage to assist patient with request.  I saw in my chart tonight that I had a CT scan scheduled and that I canceled it. I did not know it was scheduled and I didnt cancel it. Please call me tomorrow and let me know if I am still having the CT scan. Thank you.

## 2018-08-30 NOTE — Telephone Encounter (Signed)
Patient is calling regarding the CT scan that was scheduled and then canceled. Patient she can do an afternoon CT scan , she has to work around her spouse's schedule. She is also asking for her recent lab results.

## 2018-08-31 NOTE — Telephone Encounter (Signed)
-----   Message from Megan Salon, MD sent at 08/30/2018  1:49 PM EDT ----- Please let pt know her CBC, TSH, Amylase, lipase, testing for celiac and CMP (except for her sodium which was only 1 point above normal) was all normal.  Urinalysis was normal as well so not culture done.  I did place orders for stool testing and I'm not sure if this has been returned at this point.

## 2018-08-31 NOTE — Telephone Encounter (Signed)
Patient notified of all lab results and message from Dr. Sabra Heck.  Advised CT not approved by [facility] after Dr. Sabra Heck called for approval.  Pt declines referral to GI, she will follow up with Dr. Collene Mares if symptoms continue.  Order for CT scan DC at this time.  Pt will drop off stool sample when it is convenient for her.  Encounter closed.

## 2018-09-04 ENCOUNTER — Other Ambulatory Visit: Payer: BLUE CROSS/BLUE SHIELD

## 2018-09-20 ENCOUNTER — Other Ambulatory Visit: Payer: Self-pay | Admitting: Obstetrics & Gynecology

## 2018-09-20 NOTE — Telephone Encounter (Signed)
It sounds like the patient needs further evaluation. I would recommend f/u with her primary.

## 2018-09-20 NOTE — Telephone Encounter (Signed)
Medication refill request: flexeril 5mg  Last AEX:  12-27-2017 Next AEX: 01-04-2019 Last MMG (if hormonal medication request): n/a Refill authorized: last given 08-28-2018 #30/0 refills, take 3 times daily as needed for muscle spasms. Please approve if appropriate

## 2018-09-20 NOTE — Telephone Encounter (Signed)
Patient is calling regarding refill for Flexeril 5MG . Patient uses [facility] in [place].

## 2018-09-21 NOTE — Telephone Encounter (Signed)
Spoke with patient, advised as seen below per Dr. Jertson. Patient verbalizes understanding and is agreeable. Encounter closed.  

## 2019-01-04 ENCOUNTER — Ambulatory Visit: Payer: BLUE CROSS/BLUE SHIELD | Admitting: Obstetrics & Gynecology

## 2019-01-04 ENCOUNTER — Ambulatory Visit: Payer: BLUE CROSS/BLUE SHIELD

## 2019-01-29 ENCOUNTER — Telehealth: Payer: Self-pay | Admitting: Obstetrics & Gynecology

## 2019-01-29 NOTE — Telephone Encounter (Signed)
Patient want to let Dr Sabra Heck know she tested positive for Covid-19.

## 2019-01-29 NOTE — Telephone Encounter (Signed)
Call to patient. States she had positive Covid test at [facility] drive thru screening.  She was told to notifyl all providers. States she does not have PCP but is currently reaching out to [facility]. Has personal connection to female PCP there that she plans to see for primary care.  States symptoms started as UTI and kidney stone.  Advised to go to ER if has any SOB fever or worsening symptoms. Patient states she has been given all this info from "Covid nurse."  She is just calling to let us know.     No info noted in chart or Care Everywhere.  Routing to Dr Talbert Nan, covering for Dr Sabra Heck.  Encounter closed.

## 2019-02-09 ENCOUNTER — Ambulatory Visit
Admission: RE | Admit: 2019-02-09 | Discharge: 2019-02-09 | Disposition: A | Discharge status: Home / Self Care | Payer: BC Managed Care – PPO | Source: Ambulatory Visit | Attending: Obstetrics & Gynecology | Admitting: Obstetrics & Gynecology

## 2019-02-09 ENCOUNTER — Other Ambulatory Visit: Payer: Self-pay

## 2019-02-09 DIAGNOSIS — Z1231 Encounter for screening mammogram for malignant neoplasm of breast: Secondary | ICD-10-CM | POA: Diagnosis not present

## 2019-02-14 ENCOUNTER — Other Ambulatory Visit: Payer: Self-pay | Admitting: Obstetrics & Gynecology

## 2019-02-14 DIAGNOSIS — R928 Other abnormal and inconclusive findings on diagnostic imaging of breast: Secondary | ICD-10-CM

## 2019-02-16 ENCOUNTER — Ambulatory Visit
Admission: RE | Admit: 2019-02-16 | Discharge: 2019-02-16 | Disposition: A | Discharge status: Home / Self Care | Payer: BC Managed Care – PPO | Source: Ambulatory Visit | Attending: Obstetrics & Gynecology | Admitting: Obstetrics & Gynecology

## 2019-02-16 ENCOUNTER — Other Ambulatory Visit: Payer: Self-pay

## 2019-02-16 ENCOUNTER — Other Ambulatory Visit: Payer: Self-pay | Admitting: Obstetrics & Gynecology

## 2019-02-16 DIAGNOSIS — R928 Other abnormal and inconclusive findings on diagnostic imaging of breast: Secondary | ICD-10-CM | POA: Diagnosis not present

## 2019-02-16 DIAGNOSIS — N6011 Diffuse cystic mastopathy of right breast: Secondary | ICD-10-CM | POA: Diagnosis not present

## 2019-04-13 ENCOUNTER — Ambulatory Visit: Payer: BLUE CROSS/BLUE SHIELD | Admitting: Obstetrics & Gynecology

## 2019-06-27 DIAGNOSIS — B353 Tinea pedis: Secondary | ICD-10-CM | POA: Diagnosis not present

## 2019-06-27 DIAGNOSIS — D485 Neoplasm of uncertain behavior of skin: Secondary | ICD-10-CM | POA: Diagnosis not present

## 2019-06-27 DIAGNOSIS — L570 Actinic keratosis: Secondary | ICD-10-CM | POA: Diagnosis not present

## 2019-06-27 DIAGNOSIS — C4401 Basal cell carcinoma of skin of lip: Secondary | ICD-10-CM | POA: Diagnosis not present

## 2019-07-05 DIAGNOSIS — C4401 Basal cell carcinoma of skin of lip: Secondary | ICD-10-CM | POA: Diagnosis not present

## 2019-08-13 DIAGNOSIS — S233XXA Sprain of ligaments of thoracic spine, initial encounter: Secondary | ICD-10-CM | POA: Diagnosis not present

## 2019-08-13 DIAGNOSIS — M7502 Adhesive capsulitis of left shoulder: Secondary | ICD-10-CM | POA: Diagnosis not present

## 2019-08-13 DIAGNOSIS — S134XXA Sprain of ligaments of cervical spine, initial encounter: Secondary | ICD-10-CM | POA: Diagnosis not present

## 2019-08-13 DIAGNOSIS — S338XXA Sprain of other parts of lumbar spine and pelvis, initial encounter: Secondary | ICD-10-CM | POA: Diagnosis not present

## 2019-08-15 DIAGNOSIS — M7502 Adhesive capsulitis of left shoulder: Secondary | ICD-10-CM | POA: Diagnosis not present

## 2019-08-15 DIAGNOSIS — S134XXA Sprain of ligaments of cervical spine, initial encounter: Secondary | ICD-10-CM | POA: Diagnosis not present

## 2019-08-15 DIAGNOSIS — S233XXA Sprain of ligaments of thoracic spine, initial encounter: Secondary | ICD-10-CM | POA: Diagnosis not present

## 2019-08-15 DIAGNOSIS — S338XXA Sprain of other parts of lumbar spine and pelvis, initial encounter: Secondary | ICD-10-CM | POA: Diagnosis not present

## 2019-08-17 DIAGNOSIS — M7502 Adhesive capsulitis of left shoulder: Secondary | ICD-10-CM | POA: Diagnosis not present

## 2019-08-17 DIAGNOSIS — S233XXA Sprain of ligaments of thoracic spine, initial encounter: Secondary | ICD-10-CM | POA: Diagnosis not present

## 2019-08-17 DIAGNOSIS — S134XXA Sprain of ligaments of cervical spine, initial encounter: Secondary | ICD-10-CM | POA: Diagnosis not present

## 2019-08-17 DIAGNOSIS — S338XXA Sprain of other parts of lumbar spine and pelvis, initial encounter: Secondary | ICD-10-CM | POA: Diagnosis not present

## 2019-08-21 DIAGNOSIS — Z299 Encounter for prophylactic measures, unspecified: Secondary | ICD-10-CM | POA: Diagnosis not present

## 2019-08-21 DIAGNOSIS — Z713 Dietary counseling and surveillance: Secondary | ICD-10-CM | POA: Diagnosis not present

## 2019-08-21 DIAGNOSIS — Z2821 Immunization not carried out because of patient refusal: Secondary | ICD-10-CM | POA: Diagnosis not present

## 2019-08-21 DIAGNOSIS — Z6825 Body mass index (BMI) 25.0-25.9, adult: Secondary | ICD-10-CM | POA: Diagnosis not present

## 2019-08-21 DIAGNOSIS — Z789 Other specified health status: Secondary | ICD-10-CM | POA: Diagnosis not present

## 2019-08-21 DIAGNOSIS — M2550 Pain in unspecified joint: Secondary | ICD-10-CM | POA: Diagnosis not present

## 2019-08-31 DIAGNOSIS — Z6825 Body mass index (BMI) 25.0-25.9, adult: Secondary | ICD-10-CM | POA: Diagnosis not present

## 2019-08-31 DIAGNOSIS — Z1331 Encounter for screening for depression: Secondary | ICD-10-CM | POA: Diagnosis not present

## 2019-08-31 DIAGNOSIS — Z1211 Encounter for screening for malignant neoplasm of colon: Secondary | ICD-10-CM | POA: Diagnosis not present

## 2019-08-31 DIAGNOSIS — M25512 Pain in left shoulder: Secondary | ICD-10-CM | POA: Diagnosis not present

## 2019-08-31 DIAGNOSIS — Z0000 Encounter for general adult medical examination without abnormal findings: Secondary | ICD-10-CM | POA: Diagnosis not present

## 2019-08-31 DIAGNOSIS — Z299 Encounter for prophylactic measures, unspecified: Secondary | ICD-10-CM | POA: Diagnosis not present

## 2019-08-31 DIAGNOSIS — Z90710 Acquired absence of both cervix and uterus: Secondary | ICD-10-CM | POA: Diagnosis not present

## 2019-08-31 DIAGNOSIS — M797 Fibromyalgia: Secondary | ICD-10-CM | POA: Diagnosis not present

## 2019-09-07 DIAGNOSIS — M797 Fibromyalgia: Secondary | ICD-10-CM | POA: Diagnosis not present

## 2019-09-07 DIAGNOSIS — Z299 Encounter for prophylactic measures, unspecified: Secondary | ICD-10-CM | POA: Diagnosis not present

## 2019-09-07 DIAGNOSIS — M25512 Pain in left shoulder: Secondary | ICD-10-CM | POA: Diagnosis not present

## 2019-09-14 ENCOUNTER — Telehealth: Payer: Self-pay | Admitting: Obstetrics & Gynecology

## 2019-09-14 DIAGNOSIS — M797 Fibromyalgia: Secondary | ICD-10-CM | POA: Diagnosis not present

## 2019-09-14 DIAGNOSIS — R5383 Other fatigue: Secondary | ICD-10-CM | POA: Diagnosis not present

## 2019-09-14 DIAGNOSIS — Z789 Other specified health status: Secondary | ICD-10-CM | POA: Diagnosis not present

## 2019-09-14 DIAGNOSIS — M2550 Pain in unspecified joint: Secondary | ICD-10-CM | POA: Diagnosis not present

## 2019-09-14 DIAGNOSIS — Z299 Encounter for prophylactic measures, unspecified: Secondary | ICD-10-CM | POA: Diagnosis not present

## 2019-09-14 DIAGNOSIS — G4700 Insomnia, unspecified: Secondary | ICD-10-CM | POA: Diagnosis not present

## 2019-09-14 DIAGNOSIS — M7061 Trochanteric bursitis, right hip: Secondary | ICD-10-CM | POA: Diagnosis not present

## 2019-09-14 NOTE — Telephone Encounter (Signed)
Patient recently discovered a mass in her abdomen and would like to see Dr.Miller.

## 2019-09-14 NOTE — Telephone Encounter (Signed)
I will need records and any imaging she has done since I saw her last a year ago.  She can bring records with her if she wants.  Thanks.

## 2019-09-14 NOTE — Telephone Encounter (Signed)
Spoke to pt. Pt states having a lot of abd pain and has been seen by  PCP Dr Cyndi Bender. Pt states seeing rheumatologist today and was told to see Dr Sabra Heck as soon as possible for abd mass. Having abd and back pain for 1 year ago and has continued to get worse since Christmas. Pt had hysterectomy in 01/2015. Pt not on HRT.  Pt declines earlier visit with another provider since Dr Sabra Heck out of office. Pt scheduled with Dr Sabra Heck on 09/24/2019 at 4:30 pm. Pt advised to take pain meds as prescribed by rheumatologist and to seek emergency care if severe abd pain or bleeding occurs. Pt agreeable and verbalized understanding.   Routing to Dr Sabra Heck for any additional recommendations and advice.

## 2019-09-17 NOTE — Telephone Encounter (Signed)
Spoke to pt. Pt updated on getting recent records from Dr Cyndi Bender and rheumatologist. Pt agreeable. Will see Dr Sabra Heck on 09/24/2019.   Encounter closed.

## 2019-09-19 NOTE — Progress Notes (Signed)
GYNECOLOGY  VISIT  CC:   Left inguinal regional mass/tenderness  HPI: 54 y.o. G49P2002 Married White or Caucasian female here for concerns about possible abdominal pain/mass.  She's been seeing a chiropractor for lower back pain and pins and needles in her feet.  She's also been having some RLQ pain and felt a possible mass in her groin.  Notes she is also having pain in her groin that seems to extend around to her buttocks and lower back.  Did have hernia repair as a child.    She and I discussed imaging last year but insurance didn't cover it.  She was having pain and bloating at the time.  Recommended GI referral but she did not go as this was around the beginning of the Covid pandemic.      Denies vaginal bleeding.  Having dyspareunia and has some spotting after intercourse.  Is PMP with LMP 2016.  Has declined using any treatment for menopausal vaginal changes.  GYNECOLOGIC HISTORY: Patient's last menstrual period was 01/27/2015. Contraception: hysterectomy Menopausal hormone therapy: none poct urine-neg  Patient Active Problem List   Diagnosis Date Noted  . Neuromuscular disorder ([place])   . Migraines   . Melanoma ([place])   . Fatigue   . Anxiety     Past Medical History:  Diagnosis Date  . Anxiety   . Arthritis   . Asthma    hx  . Basal cell carcinoma    on lip  . Blurred vision   . DDD (degenerative disc disease), cervical   . Fatigue   . History of hemorrhoids   . History of hernia repair   . Hypertension    no medication  . Joint pain   . Melanoma ([place]) 2000-2005   x3  . Migraines   . Neuromuscular disorder ([place])   . Paresthesias   . PONV (postoperative nausea and vomiting)     Past Surgical History:  Procedure Laterality Date  . AUGMENTATION MAMMAPLASTY Bilateral 2008  . BREAST BIOPSY Left 08/22/2012   Procedure: Needle localization removal left breast mass;  Surgeon: Haywood Lasso, MD;  Location: [facility];  Service: General;  Laterality:  Left;  Needle localization removal left breast mass  . BREAST EXCISIONAL BIOPSY Left    benign  . BREAST SURGERY     "uplift"  with saline implants  . BREAST SURGERY  2002   lt lump-negative  . CYSTOSCOPY N/A 02/17/2015   Procedure: CYSTOSCOPY;  Surgeon: Megan Salon, MD;  Location: [facility];  Service: Gynecology;  Laterality: N/A;  . HERNIA REPAIR    . LAPAROSCOPIC BILATERAL SALPINGECTOMY Bilateral 02/17/2015   Procedure: LAPAROSCOPIC BILATERAL SALPINGECTOMY;  Surgeon: Megan Salon, MD;  Location: [facility];  Service: Gynecology;  Laterality: Bilateral;  . LAPAROSCOPIC HYSTERECTOMY N/A 02/17/2015   Procedure: HYSTERECTOMY TOTAL LAPAROSCOPIC;  Surgeon: Megan Salon, MD;  Location: [facility];  Service: Gynecology;  Laterality: N/A;  . MELANOMA EXCISION     X3  . TUBAL LIGATION  in 20's   then later tubal reversal  . Tubal Reversal      MEDS:   Current Outpatient Medications on File Prior to Visit  Medication Sig Dispense Refill  . Chlorphen-Pseudoephed-APAP (TYLENOL ALLERGY SINUS PO) Take by mouth daily. Patient reported, takes daily OTC    . cyclobenzaprine (FLEXERIL) 10 MG tablet Take 10 mg by mouth 3 (three) times daily.    . diphenhydrAMINE (BENADRYL) 25 mg capsule Take 25 mg by mouth at bedtime as  needed.    . hyoscyamine (ANASPAZ) 0.125 MG TBDP disintergrating tablet Place 0.125 mg under the tongue every 6 (six) hours as needed.    . traMADol (ULTRAM) 50 MG tablet Take 50 mg by mouth 3 (three) times daily as needed.     No current facility-administered medications on file prior to visit.    ALLERGIES: Dilaudid [hydromorphone hcl], Codeine, Imitrex [sumatriptan], Other, Penicillins, and Ultram [tramadol]  Family History  Problem Relation Age of Onset  . Colon cancer Mother 43  . Colon cancer Father 12  . Cancer Paternal Aunt 30       Breast  . Breast cancer Paternal Aunt     SH:  Married, non smoker  Review of Systems  Constitutional: Negative.   HENT: Negative.   Eyes:  Negative.   Respiratory: Negative.   Cardiovascular: Negative.   Gastrointestinal: Negative.   Endocrine: Negative.   Genitourinary:       Abdominal pain/mass, knot on breast, swollen lymph nodes  Musculoskeletal: Negative.   Skin: Negative.   Allergic/Immunologic: Negative.   Neurological: Negative.   Psychiatric/Behavioral: Negative.     PHYSICAL EXAMINATION:    BP 120/78   Pulse 70   Temp (!) 97.5 F (36.4 C) (Skin)   Resp 16   Wt 146 lb (66.2 kg)   LMP 01/27/2015   BMI 25.86 kg/m     General appearance: alert, cooperative and appears stated age Abdomen: soft, non-tender; bowel sounds normal; no masses,  no organomegaly, mild fullness noted along right inguinal region with very specific tenderness in one specific location Lymph:  no inguinal LAD noted  Pelvic: External genitalia:  no lesions              Urethra:  normal appearing urethra with no masses, tenderness or lesions              Bartholins and Skenes: normal                 Vagina: normal appearing vagina with normal color and discharge, no lesions              Cervix: absent              Bimanual Exam:  Uterus:  uterus absent              Adnexa: no mass, fullness, tenderness  Chaperone, Terence Lux, CMA, was present for exam.  Assessment: RLQ/inguinal region tenderness and fullness H/o right inguinal hernia repair as a child  Plan: CT abd/pelvic to rule in/out hernia or possible LAD

## 2019-09-24 ENCOUNTER — Other Ambulatory Visit: Payer: Self-pay

## 2019-09-24 ENCOUNTER — Ambulatory Visit (INDEPENDENT_AMBULATORY_CARE_PROVIDER_SITE_OTHER): Payer: BC Managed Care – PPO | Admitting: Obstetrics & Gynecology

## 2019-09-24 ENCOUNTER — Encounter: Payer: Self-pay | Admitting: Obstetrics & Gynecology

## 2019-09-24 VITALS — BP 120/78 | HR 70 | Temp 97.5°F | Resp 16 | Wt 146.0 lb

## 2019-09-24 DIAGNOSIS — R1031 Right lower quadrant pain: Secondary | ICD-10-CM | POA: Diagnosis not present

## 2019-09-24 DIAGNOSIS — R5383 Other fatigue: Secondary | ICD-10-CM

## 2019-09-24 DIAGNOSIS — F419 Anxiety disorder, unspecified: Secondary | ICD-10-CM

## 2019-09-24 DIAGNOSIS — R109 Unspecified abdominal pain: Secondary | ICD-10-CM | POA: Diagnosis not present

## 2019-09-24 DIAGNOSIS — G709 Myoneural disorder, unspecified: Secondary | ICD-10-CM | POA: Diagnosis not present

## 2019-09-24 LAB — POCT URINALYSIS DIPSTICK
Bilirubin, UA: NEGATIVE
Blood, UA: NEGATIVE
Glucose, UA: NEGATIVE
Ketones, UA: NEGATIVE
Leukocytes, UA: NEGATIVE
Nitrite, UA: NEGATIVE
Protein, UA: NEGATIVE
Urobilinogen, UA: NEGATIVE E.U./dL — AB
pH, UA: 5 (ref 5.0–8.0)

## 2019-09-26 ENCOUNTER — Encounter: Payer: Self-pay | Admitting: Obstetrics & Gynecology

## 2019-09-26 DIAGNOSIS — F419 Anxiety disorder, unspecified: Secondary | ICD-10-CM | POA: Insufficient documentation

## 2019-09-26 DIAGNOSIS — G709 Myoneural disorder, unspecified: Secondary | ICD-10-CM | POA: Insufficient documentation

## 2019-09-26 DIAGNOSIS — R5383 Other fatigue: Secondary | ICD-10-CM | POA: Insufficient documentation

## 2019-09-26 DIAGNOSIS — G43909 Migraine, unspecified, not intractable, without status migrainosus: Secondary | ICD-10-CM | POA: Insufficient documentation

## 2019-09-26 DIAGNOSIS — C439 Malignant melanoma of skin, unspecified: Secondary | ICD-10-CM | POA: Insufficient documentation

## 2019-09-27 ENCOUNTER — Telehealth: Payer: Self-pay

## 2019-09-27 DIAGNOSIS — R1031 Right lower quadrant pain: Secondary | ICD-10-CM

## 2019-09-27 NOTE — Telephone Encounter (Signed)
Spoke to Therapist, sports at [facility] [employer] for [facility] clinical information given.   Will need peer to peer review called by Dr Sabra Heck to [phone].   Routing to Dr Sabra Heck.

## 2019-09-27 NOTE — Telephone Encounter (Signed)
Spoke with Langley Gauss at [facility] and pt scheduled for CT abd/pelvis scan on 10/15/2019 at 1130 am.   Left message for pt to return call to triage RN for CT appt and instructions.

## 2019-09-27 NOTE — Telephone Encounter (Signed)
Patient placed in IMG hold.  

## 2019-09-27 NOTE — Telephone Encounter (Signed)
Left message for pt to return call to triage RN. 

## 2019-09-27 NOTE — Telephone Encounter (Signed)
Spoke with pt. Pt given CT scan appt at [place] and instructions on picking up oral contrast on 4/23 due to weekend and also no solid food 4 hours prior to appt time, can have liquids and take meds as prescribed. . Pt agreeable and verbalized understanding.   Routing to Dr Sabra Heck for review.  Cc: Sharee Pimple, RN to place pt in IMG hold.  Cc: Alfonse Spruce for precert. Orders placed.  Encounter closed.

## 2019-09-27 NOTE — Telephone Encounter (Signed)
Patient is returning a call to Stephanie. °

## 2019-09-28 DIAGNOSIS — G4700 Insomnia, unspecified: Secondary | ICD-10-CM | POA: Diagnosis not present

## 2019-09-28 DIAGNOSIS — R5383 Other fatigue: Secondary | ICD-10-CM | POA: Diagnosis not present

## 2019-09-28 DIAGNOSIS — Z299 Encounter for prophylactic measures, unspecified: Secondary | ICD-10-CM | POA: Diagnosis not present

## 2019-09-28 DIAGNOSIS — M62838 Other muscle spasm: Secondary | ICD-10-CM | POA: Diagnosis not present

## 2019-09-28 DIAGNOSIS — E559 Vitamin D deficiency, unspecified: Secondary | ICD-10-CM | POA: Diagnosis not present

## 2019-10-01 DIAGNOSIS — M797 Fibromyalgia: Secondary | ICD-10-CM | POA: Diagnosis not present

## 2019-10-01 DIAGNOSIS — T63301A Toxic effect of unspecified spider venom, accidental (unintentional), initial encounter: Secondary | ICD-10-CM | POA: Diagnosis not present

## 2019-10-01 DIAGNOSIS — Z299 Encounter for prophylactic measures, unspecified: Secondary | ICD-10-CM | POA: Diagnosis not present

## 2019-10-02 NOTE — Telephone Encounter (Signed)
Spoke with pt. Pt given update on denied CT and recommendations per Dr Sabra Heck. Pt declines GI referral at this time. Will call back to office if decides to have referral.   Routing to Dr Sabra Heck for review.  Encounter closed.

## 2019-10-02 NOTE — Telephone Encounter (Signed)
CT was denied.  This happened last year.  Denies because she's not seen another specialist or had any other imaging.  Would recommend GI referral.

## 2019-10-08 ENCOUNTER — Telehealth: Payer: Self-pay

## 2019-10-08 NOTE — Telephone Encounter (Signed)
Patient called to discuss test that were denied and a referral. Patient would also like to go over the possibilities of diagnosis.

## 2019-10-08 NOTE — Telephone Encounter (Signed)
Left message for pt to return call to triage RN. 

## 2019-10-12 NOTE — Telephone Encounter (Signed)
Spoke with patient. Patient reports authorization denied for CT abdomen/pelvis w/ contrast. Patient asking for next steps in care. Patient has established care with PCP, Dr. Arsenio Katz at [facility]. Patient states she does not want to proceed with CT at this point unless it is "life threatening or necessary". Patient states referral was previously discussed to GI, is unsure if this will be helpful.   Advised patient I will review plan of care with Dr. Sabra Heck and our office will f/u with recommendations. ER precautions reviewed for new or worsening symptoms. Patient verbalizes understanding and is agreeable.   Dr. Sabra Heck -please review and advise.

## 2019-10-15 ENCOUNTER — Other Ambulatory Visit: Payer: BC Managed Care – PPO

## 2019-10-15 NOTE — Telephone Encounter (Signed)
As the concern and reasoning for CT is hernia, it is ok to wait to see if symptoms worsen or if PCP has other recommendations.  Thanks.  Ok to close encounter.

## 2019-10-15 NOTE — Telephone Encounter (Signed)
Spoke with patient. Advised as seen below per Dr. Sabra Heck. Patient verbalizes understanding and is agreeable.   Encounter closed.

## 2019-11-15 NOTE — Telephone Encounter (Signed)
Megan Salon, MD  Burnice Logan, RN  Yes, ok to remove from imaging hold. Thanks.    Patient removed from IMG hold.

## 2019-12-31 DIAGNOSIS — E559 Vitamin D deficiency, unspecified: Secondary | ICD-10-CM | POA: Diagnosis not present

## 2020-01-04 DIAGNOSIS — M797 Fibromyalgia: Secondary | ICD-10-CM | POA: Diagnosis not present

## 2020-01-04 DIAGNOSIS — Z789 Other specified health status: Secondary | ICD-10-CM | POA: Diagnosis not present

## 2020-01-04 DIAGNOSIS — R5383 Other fatigue: Secondary | ICD-10-CM | POA: Diagnosis not present

## 2020-01-04 DIAGNOSIS — G4700 Insomnia, unspecified: Secondary | ICD-10-CM | POA: Diagnosis not present

## 2020-01-04 DIAGNOSIS — Z299 Encounter for prophylactic measures, unspecified: Secondary | ICD-10-CM | POA: Diagnosis not present

## 2020-01-04 DIAGNOSIS — M7552 Bursitis of left shoulder: Secondary | ICD-10-CM | POA: Diagnosis not present

## 2020-01-08 DIAGNOSIS — Z85828 Personal history of other malignant neoplasm of skin: Secondary | ICD-10-CM | POA: Diagnosis not present

## 2020-01-08 DIAGNOSIS — L570 Actinic keratosis: Secondary | ICD-10-CM | POA: Diagnosis not present

## 2020-01-23 ENCOUNTER — Other Ambulatory Visit: Payer: Self-pay | Admitting: Nurse Practitioner

## 2020-01-23 DIAGNOSIS — Z1231 Encounter for screening mammogram for malignant neoplasm of breast: Secondary | ICD-10-CM

## 2020-02-11 ENCOUNTER — Other Ambulatory Visit: Payer: Self-pay

## 2020-02-11 ENCOUNTER — Ambulatory Visit
Admission: RE | Admit: 2020-02-11 | Discharge: 2020-02-11 | Disposition: A | Discharge status: Home / Self Care | Payer: BC Managed Care – PPO | Source: Ambulatory Visit | Attending: Nurse Practitioner | Admitting: Nurse Practitioner

## 2020-02-11 DIAGNOSIS — Z1231 Encounter for screening mammogram for malignant neoplasm of breast: Secondary | ICD-10-CM | POA: Diagnosis not present

## 2020-02-12 ENCOUNTER — Other Ambulatory Visit: Payer: Self-pay | Admitting: Nurse Practitioner

## 2020-02-12 DIAGNOSIS — M7989 Other specified soft tissue disorders: Secondary | ICD-10-CM

## 2020-02-15 ENCOUNTER — Other Ambulatory Visit: Payer: Self-pay | Admitting: Obstetrics & Gynecology

## 2020-02-15 DIAGNOSIS — M7989 Other specified soft tissue disorders: Secondary | ICD-10-CM

## 2020-02-18 ENCOUNTER — Ambulatory Visit
Admission: RE | Admit: 2020-02-18 | Discharge: 2020-02-18 | Disposition: A | Discharge status: Home / Self Care | Payer: BC Managed Care – PPO | Source: Ambulatory Visit | Attending: Nurse Practitioner | Admitting: Nurse Practitioner

## 2020-02-18 ENCOUNTER — Other Ambulatory Visit: Payer: Self-pay | Admitting: Obstetrics & Gynecology

## 2020-02-18 ENCOUNTER — Other Ambulatory Visit: Payer: Self-pay

## 2020-02-18 DIAGNOSIS — M7989 Other specified soft tissue disorders: Secondary | ICD-10-CM

## 2020-02-18 DIAGNOSIS — N6489 Other specified disorders of breast: Secondary | ICD-10-CM | POA: Diagnosis not present

## 2020-04-11 DIAGNOSIS — E559 Vitamin D deficiency, unspecified: Secondary | ICD-10-CM | POA: Diagnosis not present

## 2020-04-21 DIAGNOSIS — R1903 Right lower quadrant abdominal swelling, mass and lump: Secondary | ICD-10-CM | POA: Diagnosis not present

## 2020-04-21 DIAGNOSIS — R935 Abnormal findings on diagnostic imaging of other abdominal regions, including retroperitoneum: Secondary | ICD-10-CM | POA: Diagnosis not present

## 2020-04-21 DIAGNOSIS — R1031 Right lower quadrant pain: Secondary | ICD-10-CM | POA: Diagnosis not present

## 2020-04-21 HISTORY — PX: BREAST IMPLANT REMOVAL: SUR1101

## 2020-04-28 DIAGNOSIS — Z299 Encounter for prophylactic measures, unspecified: Secondary | ICD-10-CM | POA: Diagnosis not present

## 2020-04-28 DIAGNOSIS — M797 Fibromyalgia: Secondary | ICD-10-CM | POA: Diagnosis not present

## 2020-04-28 DIAGNOSIS — M25512 Pain in left shoulder: Secondary | ICD-10-CM | POA: Diagnosis not present

## 2020-04-28 DIAGNOSIS — R5383 Other fatigue: Secondary | ICD-10-CM | POA: Diagnosis not present

## 2020-04-28 DIAGNOSIS — M25569 Pain in unspecified knee: Secondary | ICD-10-CM | POA: Diagnosis not present

## 2020-07-01 ENCOUNTER — Other Ambulatory Visit: Payer: Self-pay | Admitting: Gastroenterology

## 2020-07-01 DIAGNOSIS — R1310 Dysphagia, unspecified: Secondary | ICD-10-CM

## 2020-07-08 ENCOUNTER — Other Ambulatory Visit: Payer: BC Managed Care – PPO

## 2020-07-14 ENCOUNTER — Ambulatory Visit
Admission: RE | Admit: 2020-07-14 | Discharge: 2020-07-14 | Disposition: A | Discharge status: Home / Self Care | Payer: BC Managed Care – PPO | Source: Ambulatory Visit | Attending: Gastroenterology | Admitting: Gastroenterology

## 2020-07-14 DIAGNOSIS — R1310 Dysphagia, unspecified: Secondary | ICD-10-CM

## 2021-01-12 ENCOUNTER — Ambulatory Visit (INDEPENDENT_AMBULATORY_CARE_PROVIDER_SITE_OTHER): Payer: BC Managed Care – PPO | Admitting: Obstetrics & Gynecology

## 2021-01-12 ENCOUNTER — Other Ambulatory Visit: Payer: Self-pay

## 2021-01-12 ENCOUNTER — Encounter (HOSPITAL_BASED_OUTPATIENT_CLINIC_OR_DEPARTMENT_OTHER): Payer: Self-pay | Admitting: Obstetrics & Gynecology

## 2021-01-12 VITALS — BP 122/74 | HR 96 | Resp 16 | Ht 62.5 in | Wt 124.8 lb

## 2021-01-12 DIAGNOSIS — N952 Postmenopausal atrophic vaginitis: Secondary | ICD-10-CM

## 2021-01-12 DIAGNOSIS — R499 Unspecified voice and resonance disorder: Secondary | ICD-10-CM

## 2021-01-12 DIAGNOSIS — R1031 Right lower quadrant pain: Secondary | ICD-10-CM

## 2021-01-12 DIAGNOSIS — R002 Palpitations: Secondary | ICD-10-CM

## 2021-01-12 DIAGNOSIS — Z01419 Encounter for gynecological examination (general) (routine) without abnormal findings: Secondary | ICD-10-CM

## 2021-01-12 DIAGNOSIS — R350 Frequency of micturition: Secondary | ICD-10-CM

## 2021-01-12 DIAGNOSIS — Z780 Asymptomatic menopausal state: Secondary | ICD-10-CM

## 2021-01-12 DIAGNOSIS — Z90710 Acquired absence of both cervix and uterus: Secondary | ICD-10-CM

## 2021-01-12 LAB — POCT URINALYSIS DIPSTICK
Bilirubin, UA: NEGATIVE
Blood, UA: NEGATIVE
Glucose, UA: NEGATIVE
Ketones, UA: NEGATIVE
Leukocytes, UA: NEGATIVE
Nitrite, UA: NEGATIVE
Protein, UA: NEGATIVE
Spec Grav, UA: <=1.005 — AB (ref 1.010–1.025)
Urobilinogen, UA: 0.2 E.U./dL
pH, UA: 6.5 (ref 5.0–8.0)

## 2021-01-12 MED ORDER — ESTRADIOL 0.1 MG/GM VA CREA: TOPICAL_CREAM | VAGINAL | 2 refills | Status: DC

## 2021-01-12 NOTE — Progress Notes (Signed)
55 y.o. G34P2002 Married White or Caucasian female here for visit with multiple concerns/complaints.  Overdue to wellness exam so will complete today.  Here concerns are as follows:  1)  Continues to have RLQ/groin pain.  H/o hernia repair in similar location.  CT done 04/2020 did not show anything of significance.  Thinks she had a renal stone last week.  She passes something that felt that sand.  She's not seen a urologist.    2)  She is having significant dryness and pain with intercourse.  She is not having intercourse very frequently due to this.  Does not want oral HRT.    3)  Continues to have concerns about enlarged lymph nodes.  CT scan was ordered after I saw her in 09/24/2019.  She declined having this done but then it was done in the fall.  Reviewed with pt today.  No LAD noted.  No abnormal findings were noted.  CBC last year was normal.  Reports she's had another one that was normal this year as well.  I do not have copies of this.  4) She has fibromyalgia.  She's feels she's been in denial about this.  Was started on cymbalta.  She reports her mood is much better and pain has improved.  Feels this has made a big difference.  5) Has palpitations that are intermittent.  Feels short of breast when this happens.  Has family hx of cardiovascular disease.  Has not seen cardiologist.  Has not worn monitor.  Asked if would like to see cardiology and she said yes, thank you.  6) Has noticed a change in her voice.  This feels more raspy.  Saw GI.  Had endoscopy.  Saw ENT.  Evaluation was negative.  She does have follow up with Dr. Collene Mares this year for repeat colonoscopy and endoscopy.  7)  recently looked at self with mirror and she's not sure if her anatomy is correct.  Wants me to evaluate today.  8) having a little urinary frequency and wants urine checked.  No dysuria.  No fever.  9)  had breast implants removed and uplift done.  Satisfied with this but had issues with anesthesia so has some  new anxiety about future procedures.  Patient's last menstrual period was 01/27/2015. S/p TLH    Sexually active: minimally The current method of family planning is status post hysterectomy.    Smoker:  no  Health Maintenance: Pap:  2016 prior to  History of abnormal Pap:  no MMG:  01/2020 Colonoscopy:  2019 TDaP:  2015 Shingrix:   declines Hep C testing: lab work done with PCP Screening Labs: lab work done with PCP   reports that she has never smoked. She has never used smokeless tobacco. She reports current alcohol use. She reports that she does not use drugs.  Past Medical History:  Diagnosis Date   Anxiety    Arthritis    Asthma    hx   Basal cell carcinoma    on lip   Blurred vision    DDD (degenerative disc disease), cervical    Fatigue    History of hemorrhoids    History of hernia repair    Joint pain    Melanoma ([place]) 2000-2005   x3   Migraines    Neuromuscular disorder ([place])    Paresthesias    PONV (postoperative nausea and vomiting)     Past Surgical History:  Procedure Laterality Date   AUGMENTATION MAMMAPLASTY Bilateral 2008  BREAST BIOPSY Left 08/22/2012   Procedure: Needle localization removal left breast mass;  Surgeon: Haywood Lasso, MD;  Location: [facility];  Service: General;  Laterality: Left;  Needle localization removal left breast mass   BREAST EXCISIONAL BIOPSY Left    benign   BREAST IMPLANT REMOVAL  04/2020   with uplight   BREAST SURGERY  2002   lt lump-negative   CYSTOSCOPY N/A 02/17/2015   Procedure: CYSTOSCOPY;  Surgeon: Megan Salon, MD;  Location: [facility];  Service: Gynecology;  Laterality: N/A;   HERNIA REPAIR     LAPAROSCOPIC BILATERAL SALPINGECTOMY Bilateral 02/17/2015   Procedure: LAPAROSCOPIC BILATERAL SALPINGECTOMY;  Surgeon: Megan Salon, MD;  Location: [facility];  Service: Gynecology;  Laterality: Bilateral;   LAPAROSCOPIC HYSTERECTOMY N/A 02/17/2015   Procedure: HYSTERECTOMY TOTAL LAPAROSCOPIC;   Surgeon: Megan Salon, MD;  Location: [facility];  Service: Gynecology;  Laterality: N/A;   MELANOMA EXCISION     X3   TUBAL LIGATION  in 20's   then later tubal reversal   Tubal Reversal      Current Outpatient Medications  Medication Sig Dispense Refill   Chlorphen-Pseudoephed-APAP (TYLENOL ALLERGY SINUS PO) Take by mouth daily. Patient reported, takes daily OTC     cyclobenzaprine (FLEXERIL) 10 MG tablet Take 10 mg by mouth 3 (three) times daily.     diphenhydrAMINE (BENADRYL) 25 mg capsule Take 25 mg by mouth at bedtime as needed.     DULoxetine (CYMBALTA) 30 MG capsule Take 30 mg by mouth daily.     estradiol (ESTRACE) 0.1 MG/GM vaginal cream 1 gram vaginally twice weekly 42.5 g 2   gabapentin (NEURONTIN) 100 MG capsule Take 100 mg by mouth 3 (three) times daily.     hyoscyamine (ANASPAZ) 0.125 MG TBDP disintergrating tablet Place 0.125 mg under the tongue every 6 (six) hours as needed. (Patient not taking: Reported on 01/12/2021)     traMADol (ULTRAM) 50 MG tablet Take 50 mg by mouth 3 (three) times daily as needed. (Patient not taking: Reported on 01/12/2021)     No current facility-administered medications for this visit.    Family History  Problem Relation Age of Onset   Colon cancer Mother 38   Colon cancer Father 28   Cancer Paternal Aunt 23       Breast   Breast cancer Paternal Aunt     Review of Systems  Constitutional: Negative.   HENT:  Positive for voice change.   Respiratory: Negative.    Cardiovascular:  Positive for palpitations. Negative for chest pain.  Gastrointestinal: Negative.   Genitourinary:  Positive for dyspareunia and vaginal pain (with intercourse). Negative for vaginal bleeding and vaginal discharge.  Skin: Negative.   Neurological: Negative.   Psychiatric/Behavioral:  Positive for dysphoric mood (improved with cymbalta).    Exam:   BP 122/74   Pulse 96   Resp 16   Ht 5' 2.5" (1.588 m)   Wt 124 lb 12.8 oz (56.6 kg)   LMP 01/27/2015   BMI  22.46 kg/m   Height: 5' 2.5" (158.8 cm)  General appearance: alert, cooperative and appears stated age Head: Normocephalic, without obvious abnormality, atraumatic Neck: no adenopathy, supple, symmetrical, trachea midline and thyroid normal to inspection and palpation Lungs: clear to auscultation bilaterally Breasts: normal appearance, no masses or tenderness Heart: regular rate and rhythm Abdomen: soft, non-tender; bowel sounds normal; no masses,  no organomegaly Extremities: extremities normal, atraumatic, no cyanosis or edema Skin: Skin color, texture, turgor normal.  No rashes or lesions Lymph nodes: Cervical, supraclavicular, and axillary nodes normal. No abnormal inguinal nodes palpated Neurologic: Grossly normal  Pelvic: External genitalia:  no lesions but there is tenderness in right groin right at edge of prior hernia incision location  (pt reassured that her external anatomy is completely normal.)              Urethra:  normal appearing urethra with no masses, tenderness or lesions              Bartholins and Skenes: normal                 Vagina: normal appearing vagina with normal color and no discharge, no lesions              Cervix: absent              Pap taken: No. Bimanual Exam:  Uterus:  uterus absent              Adnexa: no mass, fullness, tenderness               Rectovaginal: Confirms               Anus:  normal sphincter tone, no lesions  Chaperone, Octaviano Batty, CMA, was present for exam.  Assessment/Plan: 1. Well female exam with routine gynecological exam - last pap 2016.  Not indicated due to hysterectomy - MMG 01/2020 - colonoscopy 2019 - plan BMD closer to age 46 - vaccines reviewed - lab work done with PCP   2. Frequency of urination - POCT Urinalysis Dipstick negative today.  No culture ordered.  3. Palpitations - Ambulatory referral to Cardiology  4. Change in voice - followed by Dr. Collene Mares and has also seen ENT  5. Vaginal atrophy - will  start vaginal estrogen cream.  Estrace 1 gram pv twice weekly.  Recheck 3 months recommended.  Pt declines and will call to give updated and if has any quesitons/cocnerns  6. Postmenopausal - no HRT  7. H/O: hysterectomy - TLH/bilateral salpingectomy 2016  8. Right groin pain - with negative CT, feel should consider general surgeon consultation.  This could be a hernia.  Pt does not want surgery so declines.  States she will call if really worsens.

## 2021-01-14 ENCOUNTER — Encounter (HOSPITAL_BASED_OUTPATIENT_CLINIC_OR_DEPARTMENT_OTHER): Payer: Self-pay | Admitting: Obstetrics & Gynecology

## 2021-01-14 DIAGNOSIS — R490 Dysphonia: Secondary | ICD-10-CM | POA: Insufficient documentation

## 2021-01-14 DIAGNOSIS — Z800 Family history of malignant neoplasm of digestive organs: Secondary | ICD-10-CM | POA: Insufficient documentation

## 2021-03-10 ENCOUNTER — Encounter: Payer: Self-pay | Admitting: Cardiology

## 2021-03-10 NOTE — Progress Notes (Signed)
Cardiology Office Note  Date: 03/11/2021   ID: Kayla Harris, DOB 09-Oct-1965, MRN [id number]  PCP:  Annamarie Dawley, NP  Cardiologist:  Rozann Lesches, MD Electrophysiologist:  None   Chief Complaint  Patient presents with   Palpitations     History of Present Illness: Kayla Harris is a 55 y.o. female referred for cardiology consultation by Dr. Sabra Heck for the evaluation of palpitations.  She states that since turning 50 she has felt an intermittent sense of rapid heartbeat, mainly when she is lying down at the end of the day.  She is not bothered by palpitations specifically during activity, no associated syncope or chest pain.  She describes a family history of bicuspid aortic valve, also anomalous RCA and her son.  She has no known cardiac diagnoses.  Remote work-up with [facility] in 2011 revealed a reportedly normal transthoracic echocardiogram and also exercise echocardiogram.  I cannot review these images, but have seen the reports.  I personally reviewed her ECG today which shows normal sinus rhythm, possible left atrial enlargement.  Otherwise normal intervals.  I reviewed her medications which are noted below.  Past Medical History:  Diagnosis Date   Anxiety    Arthritis    Asthma    Basal cell carcinoma    DDD (degenerative disc disease), cervical    History of hemorrhoids    History of hernia repair    Melanoma ([place]) 2000-2005   Migraines    Paresthesias     Past Surgical History:  Procedure Laterality Date   AUGMENTATION MAMMAPLASTY Bilateral 2008   BREAST BIOPSY Left 08/22/2012   Procedure: Needle localization removal left breast mass;  Surgeon: Haywood Lasso, MD;  Location: [facility];  Service: General;  Laterality: Left;  Needle localization removal left breast mass   BREAST EXCISIONAL BIOPSY Left    benign   BREAST IMPLANT REMOVAL  04/2020   with uplight   BREAST SURGERY  2002   lt lump-negative   CYSTOSCOPY N/A  02/17/2015   Procedure: CYSTOSCOPY;  Surgeon: Megan Salon, MD;  Location: [facility];  Service: Gynecology;  Laterality: N/A;   HERNIA REPAIR     LAPAROSCOPIC BILATERAL SALPINGECTOMY Bilateral 02/17/2015   Procedure: LAPAROSCOPIC BILATERAL SALPINGECTOMY;  Surgeon: Megan Salon, MD;  Location: [facility];  Service: Gynecology;  Laterality: Bilateral;   LAPAROSCOPIC HYSTERECTOMY N/A 02/17/2015   Procedure: HYSTERECTOMY TOTAL LAPAROSCOPIC;  Surgeon: Megan Salon, MD;  Location: [facility];  Service: Gynecology;  Laterality: N/A;   MELANOMA EXCISION     X3   TUBAL LIGATION  in 20's   then later tubal reversal   Tubal Reversal      Current Outpatient Medications  Medication Sig Dispense Refill   cyclobenzaprine (FLEXERIL) 10 MG tablet Take 10 mg by mouth 3 (three) times daily.     diphenhydrAMINE (BENADRYL) 25 mg capsule Take 25 mg by mouth at bedtime as needed.     DULoxetine (CYMBALTA) 30 MG capsule Take 30 mg by mouth daily.     gabapentin (NEURONTIN) 100 MG capsule Take 100 mg by mouth 3 (three) times daily.     Chlorphen-Pseudoephed-APAP (TYLENOL ALLERGY SINUS PO) Take by mouth daily. Patient reported, takes daily OTC (Patient not taking: Reported on 03/11/2021)     estradiol (ESTRACE) 0.1 MG/GM vaginal cream 1 gram vaginally twice weekly (Patient not taking: Reported on 03/11/2021) 42.5 g 2   hyoscyamine (ANASPAZ) 0.125 MG TBDP disintergrating tablet Place 0.125 mg under  the tongue every 6 (six) hours as needed. (Patient not taking: No sig reported)     traMADol (ULTRAM) 50 MG tablet Take 50 mg by mouth 3 (three) times daily as needed. (Patient not taking: No sig reported)     No current facility-administered medications for this visit.   Allergies:  Dilaudid [hydromorphone hcl], Codeine, Imitrex [sumatriptan], Other, Penicillins, and Ultram [tramadol]   Social History: The patient  reports that she has never smoked. She has never used smokeless tobacco. She reports current alcohol use. She  reports that she does not use drugs.   Family History: The patient's family history includes Breast cancer in her paternal aunt; Cancer (age of onset: 35) in her paternal aunt; Colon cancer (age of onset: 23) in her father; Colon cancer (age of onset: 64) in her mother.   ROS: No orthopnea or PND.  Reports hand and foot tingling and pain at nighttime.  Physical Exam: VS:  BP (!) 138/30   Pulse 86   Ht 5\' 3"  (1.6 m)   Wt 130 lb 9.6 oz (59.2 kg)   LMP 01/27/2015   SpO2 97%   BMI 23.13 kg/m , BMI Body mass index is 23.13 kg/m.  Wt Readings from Last 3 Encounters:  03/11/21 130 lb 9.6 oz (59.2 kg)  01/12/21 124 lb 12.8 oz (56.6 kg)  09/24/19 146 lb (66.2 kg)    General: Patient appears comfortable at rest. HEENT: Conjunctiva and lids normal, wearing a mask. Neck: Supple, no elevated JVP or carotid bruits, no thyromegaly. Lungs: Clear to auscultation, nonlabored breathing at rest. Cardiac: Regular rate and rhythm, no S3, 1/6 systolic murmur, no pericardial rub. Abdomen: Soft, nontender, bowel sounds present. Extremities: No pitting edema, distal pulses 2+. Skin: Warm and dry. Musculoskeletal: No kyphosis. Neuropsychiatric: Alert and oriented x3, affect grossly appropriate.  ECG:  An ECG dated 01/12/2016 was personally reviewed today and demonstrated:  Normal sinus rhythm.  Recent Labwork:    Component Value Date/Time   CHOL 171 09/30/2016 1347   TRIG 125 09/30/2016 1347   HDL 73 09/30/2016 1347   CHOLHDL 2.3 09/30/2016 1347   VLDL 25 09/30/2016 1347   LDLCALC 73 09/30/2016 1347  November 2021: BUN 12, creatinine 0.69 March 2021: TSH 0.921, cholesterol 235, TG 106, HDL 75, LDL 142, Hgb 12.8, platelets 258, BUN 12, creatinine 0.63, potassium 4.3, AST 19, ALT 21  Other Studies Reviewed Today:  Echocardiogram 08/05/2009: Normal LV wall thickness and LVEF, normal RV contraction, normal left and right atrial chamber size, mild mitral regurgitation, trace tricuspid regurgitation,  no pericardial effusion.  Exercise echocardiogram 08/05/2009: Patient achieved 97% MPHR and 8 minutes on Bruce treadmill, no chest pain, no diagnostic ST segment abnormalities, no inducible wall motion abnormalities by echocardiogram to indicate ischemia.  Assessment and Plan:  1.  Palpitations as discussed above, ECG shows sinus rhythm with normal intervals, no associated syncope reported.  She states that symptoms occur mainly when she is still in the evenings, no regular pattern otherwise however, not necessarily each week.  We will obtain a 14-day Zio patch for further investigation.  2.  Heart murmur, sounds relatively benign on examination.  Given family history of bicuspid aortic valve we will obtain a follow-up echocardiogram.  I cannot review the images from her remote test in 2011, but reportedly no major valve problems at that time.  Medication Adjustments/Labs and Tests Ordered: Current medicines are reviewed at length with the patient today.  Concerns regarding medicines are outlined above.   Tests  Ordered: Orders Placed This Encounter  Procedures   EKG 12-Lead   ECHOCARDIOGRAM COMPLETE     Medication Changes: No orders of the defined types were placed in this encounter.   Disposition:  Follow up  test results.  Signed, Satira Sark, MD, [facility] 03/11/2021 9:16 AM    [facility] [place], [place], [place] [address] Phone: [phone]; Fax: [phone]

## 2021-03-11 ENCOUNTER — Ambulatory Visit (INDEPENDENT_AMBULATORY_CARE_PROVIDER_SITE_OTHER): Payer: BC Managed Care – PPO | Admitting: Cardiology

## 2021-03-11 ENCOUNTER — Ambulatory Visit (INDEPENDENT_AMBULATORY_CARE_PROVIDER_SITE_OTHER): Payer: BC Managed Care – PPO

## 2021-03-11 ENCOUNTER — Other Ambulatory Visit: Payer: Self-pay | Admitting: Cardiology

## 2021-03-11 ENCOUNTER — Encounter: Payer: Self-pay | Admitting: Cardiology

## 2021-03-11 VITALS — BP 138/30 | HR 86 | Ht 63.0 in | Wt 130.6 lb

## 2021-03-11 DIAGNOSIS — R002 Palpitations: Secondary | ICD-10-CM

## 2021-03-11 DIAGNOSIS — R011 Cardiac murmur, unspecified: Secondary | ICD-10-CM | POA: Diagnosis not present

## 2021-03-11 NOTE — Patient Instructions (Signed)
Medication Instructions:  Your physician recommends that you continue on your current medications as directed. Please refer to the Current Medication list given to you today.  *If you need a refill on your cardiac medications before your next appointment, please call your pharmacy*   Lab Work: None today  If you have labs (blood work) drawn today and your tests are completely normal, you will receive your results only by: [facility] (if you have MyChart) OR A paper copy in the mail If you have any lab test that is abnormal or we need to change your treatment, we will call you to review the results.   Testing/Procedures: Your physician has requested that you have an echocardiogram. Echocardiography is a painless test that uses sound waves to create images of your heart. It provides your doctor with information about the size and shape of your heart and how well your heart's chambers and valves are working. This procedure takes approximately one hour. There are no restrictions for this procedure.    ZIO XT- Long Term Monitor Instructions   Your physician has requested you wear your ZIO patch monitor____14___days.   This is a single patch monitor.  [employer] supplies one patch monitor per enrollment.  Additional stickers are not available.   Please do not apply patch if you will be having a Nuclear Stress Test, Echocardiogram, Cardiac CT, MRI, or Chest Xray during the time frame you would be wearing the monitor. The patch cannot be worn during these tests.  You cannot remove and re-apply the ZIO XT patch monitor.   Your ZIO patch monitor will be sent USPS Priority mail from [facility] directly to your home address. The monitor may also be mailed to a PO BOX if home delivery is not available.   It may take 3-5 days to receive your monitor after you have been enrolled.   Once you have received you monitor, please review enclosed instructions.  Your monitor has already been  registered assigning a specific monitor serial # to you.   Applying the monitor   Shave hair from upper left chest.   Hold abrader disc by orange tab.  Rub abrader in 40 strokes over left upper chest as indicated in your monitor instructions.   Clean area with 4 enclosed alcohol pads .  Use all pads to assure are is cleaned thoroughly.  Let dry.   Apply patch as indicated in monitor instructions.  Patch will be place under collarbone on left side of chest with arrow pointing upward.   Rub patch adhesive wings for 2 minutes.Remove white label marked "1".  Remove white label marked "2".  Rub patch adhesive wings for 2 additional minutes.   While looking in a mirror, press and release button in center of patch.  A small green light will flash 3-4 times .  This will be your only indicator the monitor has been turned on.     Do not shower for the first 24 hours.  You may shower after the first 24 hours.   Press button if you feel a symptom. You will hear a small click.  Record Date, Time and Symptom in the Patient Log Book.   When you are ready to remove patch, follow instructions on last 2 pages of Patient Log Book.  Stick patch monitor onto last page of Patient Log Book.   Place Patient Log Book in [place] box.  Use locking tab on box and tape box closed securely.  The [place] and  White box has [employer] on it.  Please place in mailbox as soon as possible.  Your physician should have your test results approximately 7 days after the monitor has been mailed back to [facility].   Call [place] at [phone] if you have questions regarding your ZIO XT patch monitor.  Call them immediately if you see an orange light blinking on your monitor.   If your monitor falls off in less than 4 days contact our Monitor department at [phone].  If your monitor becomes loose or falls off after 4 days call [employer] at [phone] for suggestions on securing your monitor.      Follow-Up: At [facility], you and your health needs are our priority.  As part of our continuing mission to provide you with exceptional heart care, we have created designated Provider Care Teams.  These Care Teams include your primary Cardiologist (physician) and Advanced Practice Providers (APPs -  Physician Assistants and Nurse Practitioners) who all work together to provide you with the care you need, when you need it.  We recommend signing up for the patient portal called "MyChart".  Sign up information is provided on this After Visit Summary.  MyChart is used to connect with patients for Virtual Visits (Telemedicine).  Patients are able to view lab/test results, encounter notes, upcoming appointments, etc.  Non-urgent messages can be sent to your provider as well.   To learn more about what you can do with MyChart, go to [email or web address].    Your next appointment:  We will call you with test results.

## 2021-03-15 DIAGNOSIS — R002 Palpitations: Secondary | ICD-10-CM | POA: Diagnosis not present

## 2021-04-01 ENCOUNTER — Ambulatory Visit (HOSPITAL_COMMUNITY)
Admission: RE | Admit: 2021-04-01 | Discharge: 2021-04-01 | Disposition: A | Discharge status: Home / Self Care | Payer: BC Managed Care – PPO | Source: Ambulatory Visit | Attending: Cardiology | Admitting: Cardiology

## 2021-04-01 ENCOUNTER — Other Ambulatory Visit: Payer: Self-pay

## 2021-04-01 DIAGNOSIS — R011 Cardiac murmur, unspecified: Secondary | ICD-10-CM | POA: Diagnosis not present

## 2021-04-01 LAB — ECHOCARDIOGRAM COMPLETE
Area-P 1/2: 4.06 cm2
S' Lateral: 2.4 cm

## 2021-04-01 NOTE — Progress Notes (Signed)
*  PRELIMINARY RESULTS* Echocardiogram 2D Echocardiogram has been performed.  Samuel Germany 04/01/2021, 9:55 AM

## 2021-04-06 ENCOUNTER — Telehealth: Payer: Self-pay | Admitting: *Deleted

## 2021-04-06 NOTE — Telephone Encounter (Signed)
Pt notified of monitor and Echo results

## 2021-07-08 ENCOUNTER — Other Ambulatory Visit: Payer: Self-pay | Admitting: Student

## 2021-07-08 DIAGNOSIS — Z1231 Encounter for screening mammogram for malignant neoplasm of breast: Secondary | ICD-10-CM

## 2021-07-20 ENCOUNTER — Telehealth: Payer: Self-pay | Admitting: Cardiology

## 2021-07-20 NOTE — Telephone Encounter (Signed)
Please give pt a call- she's wanting to know what her results were from her echo and when she wore her monitor. She's having a procedure done and is needing to know this information.  [phone]

## 2021-07-21 NOTE — Telephone Encounter (Signed)
Patient returning call.

## 2021-07-21 NOTE — Telephone Encounter (Signed)
October 2022 Echo and heart monitor reviewed with patient in detail

## 2021-07-29 ENCOUNTER — Other Ambulatory Visit: Payer: Self-pay | Admitting: Student

## 2021-07-29 ENCOUNTER — Ambulatory Visit
Admission: RE | Admit: 2021-07-29 | Discharge: 2021-07-29 | Disposition: A | Discharge status: Home / Self Care | Payer: BC Managed Care – PPO | Source: Ambulatory Visit | Attending: Student | Admitting: Student

## 2021-07-29 DIAGNOSIS — Z1231 Encounter for screening mammogram for malignant neoplasm of breast: Secondary | ICD-10-CM

## 2022-09-03 ENCOUNTER — Other Ambulatory Visit: Payer: Self-pay | Admitting: Student

## 2022-09-03 DIAGNOSIS — Z1231 Encounter for screening mammogram for malignant neoplasm of breast: Secondary | ICD-10-CM

## 2022-09-14 ENCOUNTER — Ambulatory Visit
Admission: RE | Admit: 2022-09-14 | Discharge: 2022-09-14 | Disposition: A | Discharge status: Home / Self Care | Payer: BC Managed Care – PPO | Source: Ambulatory Visit | Attending: Student | Admitting: Student

## 2022-09-14 DIAGNOSIS — Z1231 Encounter for screening mammogram for malignant neoplasm of breast: Secondary | ICD-10-CM

## 2022-09-20 DIAGNOSIS — Z299 Encounter for prophylactic measures, unspecified: Secondary | ICD-10-CM | POA: Diagnosis not present

## 2022-09-20 DIAGNOSIS — J029 Acute pharyngitis, unspecified: Secondary | ICD-10-CM | POA: Diagnosis not present

## 2022-09-20 DIAGNOSIS — M797 Fibromyalgia: Secondary | ICD-10-CM | POA: Diagnosis not present

## 2022-09-20 DIAGNOSIS — J069 Acute upper respiratory infection, unspecified: Secondary | ICD-10-CM | POA: Diagnosis not present

## 2023-02-09 DIAGNOSIS — L738 Other specified follicular disorders: Secondary | ICD-10-CM | POA: Diagnosis not present

## 2023-05-11 DIAGNOSIS — D485 Neoplasm of uncertain behavior of skin: Secondary | ICD-10-CM | POA: Diagnosis not present

## 2023-05-11 DIAGNOSIS — L821 Other seborrheic keratosis: Secondary | ICD-10-CM | POA: Diagnosis not present

## 2023-06-08 DIAGNOSIS — Z0000 Encounter for general adult medical examination without abnormal findings: Secondary | ICD-10-CM | POA: Diagnosis not present

## 2023-06-08 DIAGNOSIS — E538 Deficiency of other specified B group vitamins: Secondary | ICD-10-CM | POA: Diagnosis not present

## 2023-06-08 DIAGNOSIS — E559 Vitamin D deficiency, unspecified: Secondary | ICD-10-CM | POA: Diagnosis not present

## 2023-06-08 DIAGNOSIS — Z299 Encounter for prophylactic measures, unspecified: Secondary | ICD-10-CM | POA: Diagnosis not present

## 2023-06-08 DIAGNOSIS — I10 Essential (primary) hypertension: Secondary | ICD-10-CM | POA: Diagnosis not present

## 2023-06-08 DIAGNOSIS — Z1331 Encounter for screening for depression: Secondary | ICD-10-CM | POA: Diagnosis not present

## 2023-06-08 DIAGNOSIS — Z79899 Other long term (current) drug therapy: Secondary | ICD-10-CM | POA: Diagnosis not present

## 2023-06-08 DIAGNOSIS — R5383 Other fatigue: Secondary | ICD-10-CM | POA: Diagnosis not present

## 2023-06-08 DIAGNOSIS — E7800 Pure hypercholesterolemia, unspecified: Secondary | ICD-10-CM | POA: Diagnosis not present

## 2023-06-09 LAB — HEPATIC FUNCTION PANEL
ALT: 20 U/L (ref 7–35)
AST: 22 (ref 13–35)
Alkaline Phosphatase: 178 — AB (ref 25–125)

## 2023-06-09 LAB — LIPID PANEL
LDL Cholesterol: 183
Triglycerides: 110 (ref 40–160)

## 2023-06-09 LAB — TSH: TSH: 0.82 (ref 0.41–5.90)

## 2023-06-09 LAB — COMPREHENSIVE METABOLIC PANEL: eGFR: 103

## 2023-06-09 LAB — BASIC METABOLIC PANEL
BUN: 16 (ref 4–21)
Creatinine: 0.7 (ref 0.5–1.1)
Glucose: 97

## 2023-06-16 DIAGNOSIS — E049 Nontoxic goiter, unspecified: Secondary | ICD-10-CM | POA: Diagnosis not present

## 2023-06-16 DIAGNOSIS — R221 Localized swelling, mass and lump, neck: Secondary | ICD-10-CM | POA: Diagnosis not present

## 2023-07-06 DIAGNOSIS — Z299 Encounter for prophylactic measures, unspecified: Secondary | ICD-10-CM | POA: Diagnosis not present

## 2023-07-06 DIAGNOSIS — E049 Nontoxic goiter, unspecified: Secondary | ICD-10-CM | POA: Diagnosis not present

## 2023-07-06 DIAGNOSIS — I10 Essential (primary) hypertension: Secondary | ICD-10-CM | POA: Diagnosis not present

## 2023-07-19 NOTE — Patient Instructions (Signed)
Thyroid-Stimulating Hormone Test Why am I having this test? The thyroid is a gland in the lower front of the neck. It makes hormones that affect many body parts and systems, including the system that affects how quickly the body burns fuel for energy (metabolism). The pituitary gland is located just below the brain, behind the eyes and nasal passages. It helps maintain thyroid hormone levels and thyroid gland function. You may have a thyroid-stimulating hormone (TSH) test if you have possible symptoms of abnormal thyroid hormone levels. This test can help your health care provider: Diagnose a disorder of the thyroid gland or pituitary gland. Manage your condition and treatment if you have an underactive thyroid (hypothyroidism) or an overactive thyroid (hyperthyroidism). Newborn babies may have this test done to screen for hypothyroidism that is present at birth (congenital). What is being tested? This test measures the amount of TSH in your blood. TSH may also be called thyrotropin. When the thyroid does not make enough hormones, the pituitary gland releases TSH into the bloodstream to stimulate the thyroid gland to make more hormones. What kind of sample is taken?     A blood sample is required for this test. It is usually collected by inserting a needle into a blood vessel. For newborns, a small amount of blood may be collected from the umbilical cord, or by using a small needle to prick the baby's heel (heel stick). Tell a health care provider about: All medicines you are taking, including vitamins, herbs, eye drops, creams, and over-the-counter medicines. Any bleeding problems you have. Any surgeries you have had. Any medical conditions you have. Whether you are pregnant or may be pregnant. How are the results reported? Your test results will be reported as a value that indicates how much TSH is in your blood. Your health care provider will compare your results to normal ranges that were  established after testing a large group of people (reference ranges). Reference ranges may vary among labs and hospitals. For this test, common reference ranges are: Adult: 2-10 microunits/mL or 2-10 milliunits/L. Newborn: Heel stick: 3-18 microunits/mL or 3-18 milliunits/L. Umbilical cord: 3-12 microunits/mL or 3-12 milliunits/L. What do the results mean? Results that are within the reference range are considered normal. This means that you have a normal amount of TSH in your blood. Results that are higher than the reference range mean that your TSH levels are too high. This may mean: Your thyroid gland is not making enough thyroid hormones. Your thyroid medicine dosage is too low. You have a tumor on your pituitary gland. This is rare. Results that are lower than the reference range mean that your TSH levels are too low. This may be caused by hyperthyroidism or by a problem with the pituitary gland function. Talk with your health care provider about what your results mean. Questions to ask your health care provider Ask your health care provider, or the department that is doing the test: When will my results be ready? How will I get my results? What are my treatment options? What other tests do I need? What are my next steps? Summary You may have a thyroid-stimulating hormone (TSH) test if you have possible symptoms of abnormal thyroid hormone levels. The thyroid is a gland in the lower front of the neck. It makes hormones that affect many body parts and systems. The pituitary gland is located just below the brain, behind the eyes and nasal passages. It helps maintain thyroid hormone levels and thyroid gland function. This test  measures the amount of TSH in your blood. TSH is made by the pituitary gland. It may also be called thyrotropin. This information is not intended to replace advice given to you by your health care provider. Make sure you discuss any questions you have with your  health care provider. Document Revised: 06/09/2021 Document Reviewed: 06/09/2021 Elsevier Patient Education  2024 [employer].

## 2023-07-20 ENCOUNTER — Encounter: Payer: Self-pay | Admitting: Nurse Practitioner

## 2023-07-20 ENCOUNTER — Ambulatory Visit: Payer: BC Managed Care – PPO | Admitting: Nurse Practitioner

## 2023-07-20 VITALS — BP 143/77 | HR 96 | Ht 63.0 in | Wt 156.8 lb

## 2023-07-20 DIAGNOSIS — R7989 Other specified abnormal findings of blood chemistry: Secondary | ICD-10-CM

## 2023-07-20 NOTE — Progress Notes (Signed)
Endocrinology Consult Note 07/20/23    ---------------------------------------------------------------------------------------------------------------------- Subjective    Past Medical History:  Diagnosis Date   Anxiety    Arthritis    Asthma    Basal cell carcinoma    DDD (degenerative disc disease), cervical    History of hemorrhoids    History of hernia repair    Melanoma (HCC) 2000-2005   Migraines    Paresthesias     Past Surgical History:  Procedure Laterality Date   AUGMENTATION MAMMAPLASTY     BREAST BIOPSY Left 08/22/2012   Procedure: Needle localization removal left breast mass;  Surgeon: Currie Paris, MD;  Location: [facility];  Service: General;  Laterality: Left;  Needle localization removal left breast mass   BREAST EXCISIONAL BIOPSY Left    benign   BREAST IMPLANT REMOVAL  04/2020   with uplight   BREAST LUMPECTOMY     BREAST SURGERY  2002   lt lump-negative   CYSTOSCOPY N/A 02/17/2015   Procedure: CYSTOSCOPY;  Surgeon: Jerene Bears, MD;  Location: [facility];  Service: Gynecology;  Laterality: N/A;   HERNIA REPAIR     LAPAROSCOPIC BILATERAL SALPINGECTOMY Bilateral 02/17/2015   Procedure: LAPAROSCOPIC BILATERAL SALPINGECTOMY;  Surgeon: Jerene Bears, MD;  Location: [facility];  Service: Gynecology;  Laterality: Bilateral;   LAPAROSCOPIC HYSTERECTOMY N/A 02/17/2015   Procedure: HYSTERECTOMY TOTAL LAPAROSCOPIC;  Surgeon: Jerene Bears, MD;  Location: [facility];  Service: Gynecology;  Laterality: N/A;   MELANOMA EXCISION     X3   TUBAL LIGATION  in 20's   then later tubal reversal   Tubal Reversal      Social History   Socioeconomic History   Marital status: Married    Spouse name: Not on file   Number of children: Not on file   Years of education: Not on file   Highest education level: Not on file  Occupational History   Not on file  Tobacco Use   Smoking status: Never   Smokeless tobacco: Never  Vaping Use   Vaping status: Never  Used  Substance and Sexual Activity   Alcohol use: Yes    Comment: some red wine   Drug use: No   Sexual activity: Yes    Partners: Male    Birth control/protection: Surgical    Comment: hysterectomy  Other Topics Concern   Not on file  Social History Narrative   Not on file   Social Drivers of Health   Financial Resource Strain: Not on file  Food Insecurity: Not on file  Transportation Needs: Not on file  Physical Activity: Not on file  Stress: Not on file  Social Connections: Not on file  Intimate Partner Violence: Not on file    Current Outpatient Medications on File Prior to Visit  Medication Sig Dispense Refill   Chlorphen-Pseudoephed-APAP (TYLENOL ALLERGY SINUS PO) Take by mouth daily. Patient reported, takes daily OTC     Cholecalciferol (VITAMIN D3 GUMMIES PO) Take by mouth. Patient states that she takes 2 gummies daily     Coenzyme Q10 (CO Q 10 PO) Take by mouth daily.     cyclobenzaprine (FLEXERIL) 10 MG tablet Take 10 mg by mouth 3 (three) times daily.     diclofenac Sodium (VOLTAREN) 1 % GEL Apply 3 g topically as needed.     diphenhydrAMINE (BENADRYL) 25 mg capsule Take 25 mg by mouth at bedtime as needed.     DULoxetine (CYMBALTA) 30 MG capsule Take 30 mg by mouth daily.  gabapentin (NEURONTIN) 100 MG capsule Take 100 mg by mouth 3 (three) times daily.     losartan (COZAAR) 100 MG tablet Take 100 mg by mouth daily.     Multiple Vitamins-Minerals (CENTRUM SILVER PO) Take by mouth daily.     Omega-3 Fatty Acids (FISH OIL PO) Take by mouth daily.     Omeprazole Magnesium (PRILOSEC PO) Take by mouth daily.     OVER THE COUNTER MEDICATION Patient states that she takes -[employer] CBD -1 gummy at night     No current facility-administered medications on file prior to visit.      HPI   Kayla Harris is a 58 y.o.-year-old female, referred by her PCP, for evaluation for enlarged thyroid.  Thyroid U/S:             I reviewed pt's thyroid  tests: Lab Results  Component Value Date   TSH 0.82 06/09/2023   TSH 0.763 08/28/2018   TSH 0.548 12/27/2017   TSH 2.32 09/30/2016     Pt c/o: - memory impairment - hoarseness - intermittent jaw and central neck pain  Pt denies - feeling nodules in neck - dysphagia - choking - SOB with lying down  She does have FH of thyroid ds in her mother but is unsure of which dysfunction she had. No FH of thyroid cancer. No h/o radiation tx to head or neck other than MRIs for evaluation for brain lesions.  No seaweed or kelp. No recent contrast studies. No steroid use. No herbal supplements. No Biotin supplements or Hair, Skin and Nails vitamins.  Pt also has a history of fibromyalgia, IBS, vitamin D deficiency.  She sees GI routinely given strong family history of colon cancer.  Review of systems  Constitutional: + Minimally fluctuating body weight,  current Body mass index is 27.78 kg/m. , no fatigue, no subjective hyperthermia, no subjective hypothermia, ? Memory impairment Eyes: no blurry vision, no xerophthalmia ENT: no sore throat, no nodules palpated in throat, no dysphagia/odynophagia, + hoarseness, intermittent jaw and central neck pain Cardiovascular: no chest pain, no shortness of breath, no palpitations, no leg swelling Respiratory: no cough, no shortness of breath Gastrointestinal: no nausea/vomiting/diarrhea Musculoskeletal: no muscle/joint aches Skin: no rashes, no hyperemia Neurological: no tremors, no numbness, no tingling, no dizziness Psychiatric: no depression, no anxiety  ---------------------------------------------------------------------------------------------------------------------- Objective    BP (!) 143/77 (BP Location: Right Arm, Patient Position: Sitting, Cuff Size: Large)   Pulse 96   Ht 5\' 3"  (1.6 m)   Wt 156 lb 12.8 oz (71.1 kg)   LMP 01/27/2015   BMI 27.78 kg/m    BP Readings from Last 3 Encounters:  07/20/23 (!) 143/77  03/11/21 (!) 138/30   01/12/21 122/74    Wt Readings from Last 3 Encounters:  07/20/23 156 lb 12.8 oz (71.1 kg)  03/11/21 130 lb 9.6 oz (59.2 kg)  01/12/21 124 lb 12.8 oz (56.6 kg)     Physical Exam- Limited  Constitutional:  Body mass index is 27.78 kg/m. , not in acute distress, normal state of mind Eyes:  EOMI, no exophthalmos Neck: Supple Thyroid: No gross goiter R>L, no palpable nodularity Cardiovascular: RRR, no murmurs, rubs, or gallops, no edema Respiratory: Adequate breathing efforts, no crackles, rales, rhonchi, or wheezing Musculoskeletal: no gross deformities, strength intact in all four extremities, no gross restriction of joint movements Skin:  no rashes, no hyperemia Neurological: no tremor with outstretched hands    ----------------------------------------------------------------------------------------------------------------------  ASSESSMENT / PLAN:  1. Enlarged thyroid  -  I reviewed the images of her thyroid ultrasound along with the patient. Her thyroid is of normal size, does show mild heterogeneous tissue, but no nodularities.  She does not need any surveillance imaging of her thyroid unless new symptoms arise.  It does not appear that her thyroid is the main cause of her hoarseness.  She does see GI and is on Omeprazole for reflux.  I will repeat more comprehensive thyroid blood panel including antibody testing to assess for autoimmune thyroid dysfunction.  I will call patient when I get the results to discuss next steps.    Follow Up Plan: Return will call with results of thyroid labs and next steps.    I spent 45 minutes in the care of the patient today including review of labs from Thyroid Function, CMP, and other relevant labs ; imaging/biopsy records (current and previous including abstractions from other facilities); face-to-face time discussing  her lab results and symptoms, medications doses, her options of short and long term treatment based on the latest  standards of care / guidelines;   and documenting the encounter.  Kayla Harris  participated in the discussions, expressed understanding, and voiced agreement with the above plans.  All questions were answered to her satisfaction. she is encouraged to contact clinic should she have any questions or concerns prior to her return visit.    Ronny Bacon, [facility] [address] [place], [place] [address] Phone: [phone] Fax: [phone]

## 2023-07-27 DIAGNOSIS — G43909 Migraine, unspecified, not intractable, without status migrainosus: Secondary | ICD-10-CM | POA: Diagnosis not present

## 2023-07-27 DIAGNOSIS — G709 Myoneural disorder, unspecified: Secondary | ICD-10-CM | POA: Diagnosis not present

## 2023-07-27 DIAGNOSIS — R490 Dysphonia: Secondary | ICD-10-CM | POA: Diagnosis not present

## 2023-07-27 DIAGNOSIS — F419 Anxiety disorder, unspecified: Secondary | ICD-10-CM | POA: Diagnosis not present

## 2023-07-27 DIAGNOSIS — R7989 Other specified abnormal findings of blood chemistry: Secondary | ICD-10-CM | POA: Diagnosis not present

## 2023-07-27 DIAGNOSIS — C439 Malignant melanoma of skin, unspecified: Secondary | ICD-10-CM | POA: Diagnosis not present

## 2023-07-27 DIAGNOSIS — R5383 Other fatigue: Secondary | ICD-10-CM | POA: Diagnosis not present

## 2023-07-28 ENCOUNTER — Telehealth: Payer: Self-pay | Admitting: *Deleted

## 2023-07-28 ENCOUNTER — Encounter: Payer: Self-pay | Admitting: Nurse Practitioner

## 2023-07-28 LAB — TSH: TSH: 1.36 u[IU]/mL (ref 0.450–4.500)

## 2023-07-28 LAB — THYROGLOBULIN ANTIBODY: Thyroglobulin Antibody: <1 [IU]/mL (ref 0.0–0.9)

## 2023-07-28 LAB — T3, FREE: T3, Free: 3 pg/mL (ref 2.0–4.4)

## 2023-07-28 LAB — THYROID PEROXIDASE ANTIBODY: Thyroperoxidase Ab SerPl-aCnc: 16 [IU]/mL (ref 0–34)

## 2023-07-28 LAB — T4, FREE: Free T4: 0.93 ng/dL (ref 0.82–1.77)

## 2023-07-28 NOTE — Telephone Encounter (Signed)
 Noted that Laurina Popper has sent the patient a MyChart message going over her Thyroid  results.

## 2023-07-28 NOTE — Telephone Encounter (Signed)
-----   Message from Wendel Hals sent at 07/28/2023  7:53 AM EST ----- FYI: I sent mychart message going over recent thyroid  labs

## 2023-08-08 DIAGNOSIS — I781 Nevus, non-neoplastic: Secondary | ICD-10-CM | POA: Diagnosis not present

## 2023-09-21 ENCOUNTER — Ambulatory Visit: Payer: BC Managed Care – PPO | Admitting: Nurse Practitioner

## 2024-01-17 ENCOUNTER — Other Ambulatory Visit: Payer: Self-pay | Admitting: Family Medicine

## 2024-01-17 DIAGNOSIS — Z1231 Encounter for screening mammogram for malignant neoplasm of breast: Secondary | ICD-10-CM

## 2024-01-24 ENCOUNTER — Ambulatory Visit
Admission: RE | Admit: 2024-01-24 | Discharge: 2024-01-24 | Disposition: A | Discharge status: Home / Self Care | Source: Ambulatory Visit | Attending: Family Medicine | Admitting: Family Medicine

## 2024-01-24 DIAGNOSIS — Z1231 Encounter for screening mammogram for malignant neoplasm of breast: Secondary | ICD-10-CM | POA: Diagnosis not present

## 2024-02-13 DIAGNOSIS — L738 Other specified follicular disorders: Secondary | ICD-10-CM | POA: Diagnosis not present
